# Patient Record
Sex: Male | Born: 1970 | Race: Black or African American | Hispanic: No | Marital: Married | Smoking: Never smoker
Health system: Southern US, Community
[De-identification: ages and names within clinical notes are randomized; demographics above are authoritative.]

---

## 2016-06-12 ENCOUNTER — Ambulatory Visit (HOSPITAL_COMMUNITY)
Admission: EM | Admit: 2016-06-12 | Discharge: 2016-06-12 | Disposition: A | Discharge status: Home / Self Care | Payer: Medicaid Other | Attending: Family Medicine | Admitting: Family Medicine

## 2016-06-12 ENCOUNTER — Encounter (HOSPITAL_COMMUNITY): Payer: Self-pay | Admitting: Emergency Medicine

## 2016-06-12 DIAGNOSIS — R05 Cough: Secondary | ICD-10-CM

## 2016-06-12 DIAGNOSIS — J40 Bronchitis, not specified as acute or chronic: Secondary | ICD-10-CM

## 2016-06-12 DIAGNOSIS — R059 Cough, unspecified: Secondary | ICD-10-CM

## 2016-06-12 MED ORDER — BENZONATATE 100 MG PO CAPS: 200.0000 mg | ORAL_CAPSULE | Freq: Three times a day (TID) | ORAL | 0 refills | Status: DC | PRN

## 2016-06-12 MED ORDER — AZITHROMYCIN 250 MG PO TABS: 250.0000 mg | ORAL_TABLET | Freq: Every day | ORAL | 0 refills | Status: DC

## 2016-06-12 NOTE — ED Provider Notes (Signed)
CSN: [id number]     Arrival date & time 06/12/16  1351 History   None    Chief Complaint  Patient presents with  . URI   (Consider location/radiation/quality/duration/timing/severity/associated sxs/prior Treatment) Patient c/o cough and congestion and uri sx's for over a week.  He has been having cough which is productive and worse at night.   The history is provided by the patient.  URI  Presenting symptoms: congestion, fatigue and rhinorrhea   Severity:  Moderate Duration:  1 week Timing:  Constant Progression:  Worsening Chronicity:  New Relieved by:  Nothing Worsened by:  Nothing   History reviewed. No pertinent past medical history. History reviewed. No pertinent surgical history. No family history on file. Social History  Substance Use Topics  . Smoking status: Never Smoker  . Smokeless tobacco: Not on file  . Alcohol use Not on file    Review of Systems  Constitutional: Positive for fatigue.  HENT: Positive for congestion and rhinorrhea.   Eyes: Negative.   Respiratory: Negative.   Cardiovascular: Negative.   Gastrointestinal: Negative.   Endocrine: Negative.   Genitourinary: Negative.   Musculoskeletal: Negative.   Skin: Negative.   Allergic/Immunologic: Negative.   Neurological: Negative.   Hematological: Negative.   Psychiatric/Behavioral: Negative.     Allergies  Patient has no known allergies.  Home Medications   Prior to Admission medications   Medication Sig Start Date End Date Taking? Authorizing Provider  azithromycin (ZITHROMAX) 250 MG tablet Take 1 tablet (250 mg total) by mouth daily. Take first 2 tablets together, then 1 every day until finished. 06/12/16   Deatra CanterWilliam J Choua Ikner, FNP  benzonatate (TESSALON) 100 MG capsule Take 2 capsules (200 mg total) by mouth 3 (three) times daily as needed for cough. 06/12/16   Deatra CanterWilliam J Jesalyn Finazzo, FNP   Meds Ordered and Administered this Visit  Medications - No data to display  BP 107/77 (BP Location:  Left Arm)   Pulse 75   Temp 97.6 F (36.4 C) (Oral)   Resp 18   SpO2 99%  No data found.   Physical Exam  Constitutional: He appears well-developed and well-nourished.  HENT:  Head: Normocephalic and atraumatic.  Right Ear: External ear normal.  Left Ear: External ear normal.  Mouth/Throat: Oropharynx is clear and moist.  Eyes: Conjunctivae and EOM are normal. Pupils are equal, round, and reactive to light.  Neck: Normal range of motion. Neck supple.  Cardiovascular: Normal rate, regular rhythm and normal heart sounds.   Pulmonary/Chest: Effort normal and breath sounds normal.  Abdominal: Soft. Bowel sounds are normal.  Nursing note and vitals reviewed.   Urgent Care Course   Clinical Course     Procedures (including critical care time)  Labs Review Labs Reviewed - No data to display  Imaging Review No results found.   Visual Acuity Review  Right Eye Distance:   Left Eye Distance:   Bilateral Distance:    Right Eye Near:   Left Eye Near:    Bilateral Near:         MDM   1. Bronchitis   2. Cough    zpak Tessalon  Push po fluids, rest, tylenol and motrin otc prn as directed for fever, arthralgias, and myalgias.  Follow up prn if sx's continue or persist.    Deatra CanterWilliam J Mercades Bajaj, FNP 06/12/16 [phone]

## 2016-06-12 NOTE — ED Notes (Signed)
[place] been in [place]

## 2016-06-12 NOTE — ED Triage Notes (Signed)
Cough and cold symptoms and feels feverish particularly at night

## 2016-07-05 ENCOUNTER — Ambulatory Visit (INDEPENDENT_AMBULATORY_CARE_PROVIDER_SITE_OTHER): Payer: Medicaid Other | Admitting: Internal Medicine

## 2016-07-05 ENCOUNTER — Other Ambulatory Visit (HOSPITAL_COMMUNITY)
Admission: RE | Admit: 2016-07-05 | Discharge: 2016-07-05 | Disposition: A | Discharge status: Home / Self Care | Payer: Medicaid Other | Source: Ambulatory Visit | Attending: Family Medicine | Admitting: Family Medicine

## 2016-07-05 VITALS — BP 102/76 | HR 78 | Temp 98.4°F | Ht 69.0 in | Wt 183.4 lb

## 2016-07-05 DIAGNOSIS — Z789 Other specified health status: Secondary | ICD-10-CM

## 2016-07-05 DIAGNOSIS — Z0289 Encounter for other administrative examinations: Secondary | ICD-10-CM

## 2016-07-05 DIAGNOSIS — Z113 Encounter for screening for infections with a predominantly sexual mode of transmission: Secondary | ICD-10-CM | POA: Diagnosis not present

## 2016-07-05 LAB — POCT URINALYSIS DIPSTICK
BILIRUBIN UA: NEGATIVE
Blood, UA: NEGATIVE
Glucose, UA: NEGATIVE
KETONES UA: NEGATIVE
Leukocytes, UA: NEGATIVE
Nitrite, UA: NEGATIVE
PH UA: 7.5
PROTEIN UA: NEGATIVE
Spec Grav, UA: 1.015
Urobilinogen, UA: 0.2

## 2016-07-05 LAB — CBC WITH DIFFERENTIAL/PLATELET
Basophils Absolute: 0 cells/uL (ref 0–200)
Basophils Relative: 0 %
EOS PCT: 9 %
Eosinophils Absolute: 414 cells/uL (ref 15–500)
HCT: 48.1 % (ref 38.5–50.0)
HEMOGLOBIN: 16.3 g/dL (ref 13.2–17.1)
LYMPHS ABS: 2300 {cells}/uL (ref 850–3900)
Lymphocytes Relative: 50 %
MCH: 31.6 pg (ref 27.0–33.0)
MCHC: 33.9 g/dL (ref 32.0–36.0)
MCV: 93.2 fL (ref 80.0–100.0)
MONOS PCT: 13 %
MPV: 11.3 fL (ref 7.5–12.5)
Monocytes Absolute: 598 cells/uL (ref 200–950)
NEUTROS ABS: 1288 {cells}/uL — AB (ref 1500–7800)
NEUTROS PCT: 28 %
PLATELETS: 129 10*3/uL — AB (ref 140–400)
RBC: 5.16 MIL/uL (ref 4.20–5.80)
RDW: 13.2 % (ref 11.0–15.0)
WBC: 4.6 10*3/uL (ref 3.8–10.8)

## 2016-07-05 LAB — COMPREHENSIVE METABOLIC PANEL
ALBUMIN: 4.2 g/dL (ref 3.6–5.1)
ALT: 18 U/L (ref 9–46)
AST: 19 U/L (ref 10–40)
Alkaline Phosphatase: 49 U/L (ref 40–115)
BILIRUBIN TOTAL: 0.9 mg/dL (ref 0.2–1.2)
BUN: 11 mg/dL (ref 7–25)
CHLORIDE: 101 mmol/L (ref 98–110)
CO2: 29 mmol/L (ref 20–31)
CREATININE: 0.8 mg/dL (ref 0.60–1.35)
Calcium: 9.1 mg/dL (ref 8.6–10.3)
Glucose, Bld: 67 mg/dL (ref 65–99)
Potassium: 4.1 mmol/L (ref 3.5–5.3)
SODIUM: 137 mmol/L (ref 135–146)
Total Protein: 7.5 g/dL (ref 6.1–8.1)

## 2016-07-05 LAB — LIPID PANEL
Cholesterol: 172 mg/dL (ref <200)
HDL: 45 mg/dL (ref >40)
LDL CALC: 82 mg/dL (ref <100)
TRIGLYCERIDES: 227 mg/dL — AB (ref <150)
Total CHOL/HDL Ratio: 3.8 Ratio (ref <5.0)
VLDL: 45 mg/dL — AB (ref <30)

## 2016-07-05 LAB — TSH: TSH: 1.44 m[IU]/L (ref 0.40–4.50)

## 2016-07-05 NOTE — Patient Instructions (Signed)
It was nice meeting you today Mr. Louis Morse!  Today we have drawn you blood and collected a urine sample. We will discuss the results of these tests with you when you return for your next visit in 3-4 weeks. After that, we will only need to see you once a year unless you get sick or have concerns you would like to discuss.   If you have any questions or concerns, please feel free to call the clinic.   Be well,  Dr. Natale MilchLancaster

## 2016-07-05 NOTE — Assessment & Plan Note (Signed)
Not yet seen at HD.  F/u on 08/06/16.  

## 2016-07-05 NOTE — Assessment & Plan Note (Signed)
Needs interpreter. Primary Language Kinyarwanda.

## 2016-07-05 NOTE — Progress Notes (Signed)
Louis Morse utilized during today's visit.  [facility] New Patient Visit  HPI:  [facility] for a new patient appointment to establish general primary care.  ROS:  Review of Systems  Gastrointestinal: Negative for abdominal pain.  Genitourinary: Positive for flank pain.  Musculoskeletal: Negative for back pain.    Past Medical Hx:  -Kidney problem a long time ago, used to take medicine. Medication was too expensive so he could not afford to refill it after the first month. Still has same pain when standing for a long time but on sides. Same intensity on both sides. Does not currently have pain.   Past Surgical Hx:  -None  Family Hx: updated in Epic - Number of family members:  Has 10 children and is married - Number of family members in [place]:  All 10 children and wife Lives with wife and 7 youngest children. Older two are married and one is at boarding school. They have been here for a year already.   Immigrant Social History: - Name spelling correct?: NO. Louis Morse.  - Date arrived in [place]: 05/28/2016 - Country of origin: [place] - Location of refugee camp (if applicable), how long there, and what caused patient to leave home country?: Yes, [place] years in [place] war - Primary language: Kinyarwanda  -Requires intepreter (essentially speaks no [place]) - Education: Highest level of education: 2 years - Prior work: [employer] - Best family contact/phone number [phone] - Tobacco/alcohol/drug use: None; yes EtOH, one or two glasses after dinner of beer; none - Marriage Status: Married - Sexual activity: Yes  - Class B conditions: None - Were you beaten or tortured in your country or refugee camp?  No   Preventative Care History: -Seen at health department?: No  PHYSICAL EXAM: BP 102/76 (BP Location: Right Arm, Patient Position: Sitting, Cuff Size: Normal)   Pulse 78   Temp 98.4 F (36.9 C) (Oral)   Ht 5\' 9"  (1.753 m)   Wt 183 lb 6.4 oz  (83.2 kg)   SpO2 97%   BMI 27.08 kg/m  Gen: well-appearing very pleasant male in NAD HEENT: [place] AT, PERRLA, muddy sclera, no pallor, MMM, no oropharyngeal erythema or exudates, TM pearly, non-bulging bilaterally  Neck:  Supple, no lymphadenopathy Heart: RRR, no murmurs appreciated Lungs: CTAB, no wheezes, rales, rhonchi, normal WOB on RA Abdomen: soft, non-tender, non-distended, +BS, no flank pain Skin:  Hypertrophic scar noted on R temple MSK: 5/5 strength upper and lower extremities bilaterally, no gait abnormalities Neuro: A&Ox3, no focal deficits Psych: Appropriate mood and affect  Examined and interviewed with Dr. Rejeana BrockWalden  Veta Dambrosia J Karlyn Glasco, MD, MPH PGY-2 [facility] Pager [phone]

## 2016-07-06 LAB — HIV ANTIBODY (ROUTINE TESTING W REFLEX): HIV: NONREACTIVE

## 2016-07-06 LAB — ACUTE HEP PANEL AND HEP B SURFACE AB
HCV Ab: NEGATIVE
HEP B S AB: POSITIVE — AB
Hep A IgM: NONREACTIVE
Hep B C IgM: NONREACTIVE
Hepatitis B Surface Ag: NEGATIVE

## 2016-07-06 LAB — QUANTIFERON TB GOLD ASSAY (BLOOD)
INTERFERON GAMMA RELEASE ASSAY: POSITIVE — AB
MITOGEN-NIL SO: 9.43 [IU]/mL
QUANTIFERON TB AG MINUS NIL: 6.14 [IU]/mL
Quantiferon Nil Value: 0.18 IU/mL

## 2016-07-06 LAB — SICKLE CELL SCREEN: SICKLE CELL SCREEN: NEGATIVE

## 2016-07-06 LAB — RPR

## 2016-07-07 LAB — URINE CULTURE: ORGANISM ID, BACTERIA: NO GROWTH

## 2016-07-08 LAB — URINE CYTOLOGY ANCILLARY ONLY
Chlamydia: NEGATIVE
Neisseria Gonorrhea: NEGATIVE

## 2016-07-08 LAB — VARICELLA ZOSTER ANTIBODY, IGG: VARICELLA IGG: 723.5 {index} — AB (ref <135.00)

## 2016-08-05 ENCOUNTER — Ambulatory Visit (HOSPITAL_COMMUNITY)
Admission: EM | Admit: 2016-08-05 | Discharge: 2016-08-05 | Disposition: A | Discharge status: Home / Self Care | Payer: Medicaid Other | Attending: Internal Medicine | Admitting: Internal Medicine

## 2016-08-05 DIAGNOSIS — J069 Acute upper respiratory infection, unspecified: Secondary | ICD-10-CM

## 2016-08-05 NOTE — ED Provider Notes (Signed)
CSN: [id number]     Arrival date & time 08/05/16  1031 History   First MD Initiated Contact with Patient 08/05/16 1248     Chief Complaint  Patient presents with  . URI   (Consider location/radiation/quality/duration/timing/severity/associated sxs/prior Treatment) 46 year old male African immigrant, does not speak English, video interpreter used, complaining of sore throat, cough, throat pain with swallowing, runny nose, headache and occasional shortness of breath due to nasal stuffiness. Uncertain as to whether he may of had a fever. None documented.      No past medical history on file. No past surgical history on file. No family history on file. Social History  Substance Use Topics  . Smoking status: Never Smoker  . Smokeless tobacco: Not on file  . Alcohol use Not on file    Review of Systems  Constitutional: Positive for activity change. Negative for diaphoresis, fatigue and fever.  HENT: Positive for congestion, postnasal drip, rhinorrhea and sore throat. Negative for ear pain, facial swelling and trouble swallowing.   Eyes: Negative for pain, discharge and redness.  Respiratory: Positive for cough. Negative for chest tightness and shortness of breath.   Cardiovascular: Negative.   Gastrointestinal: Negative.   Musculoskeletal: Negative.  Negative for neck pain and neck stiffness.  Neurological: Negative.   All other systems reviewed and are negative.   Allergies  Patient has no known allergies.  Home Medications   Prior to Admission medications   Not on File   Meds Ordered and Administered this Visit  Medications - No data to display  There were no vitals taken for this visit. No data found.   Physical Exam  Constitutional: He is oriented to person, place, and time. He appears well-developed and well-nourished. No distress.  HENT:  Right Ear: External ear normal.  Left Ear: External ear normal.  Mouth/Throat: No oropharyngeal exudate.  Oropharynx with  minor streaky erythema, clear PND. No swelling or exudate. Airway widely patent.  Eyes: EOM are normal. Pupils are equal, round, and reactive to light.  Neck: Normal range of motion. Neck supple.  Cardiovascular: Normal rate, regular rhythm, normal heart sounds and intact distal pulses.   Pulmonary/Chest: Effort normal and breath sounds normal. No respiratory distress. He has no wheezes. He has no rales.  Musculoskeletal: Normal range of motion. He exhibits no edema.  Lymphadenopathy:    He has no cervical adenopathy.  Neurological: He is alert and oriented to person, place, and time.  Skin: Skin is warm and dry.  Nursing note and vitals reviewed.   Urgent Care Course     Procedures (including critical care time)  Labs Review Labs Reviewed - No data to display  Imaging Review No results found.   Visual Acuity Review  Right Eye Distance:   Left Eye Distance:   Bilateral Distance:    Right Eye Near:   Left Eye Near:    Bilateral Near:         MDM   1. Acute upper respiratory infection    Sudafed PE 10 mg every 4 to 6 hours as needed for congestion Allegra or Zyrtec daily as needed for drainage and runny nose. For stronger antihistamine may take Chlor-Trimeton 2 to 4 mg every 4 to 6 hours, may cause drowsiness. Saline nasal spray used frequently. Ibuprofen 600 mg every 6 hours as needed for pain, discomfort or fever. Drink plenty of fluids and stay well-hydrated.     Hayden Rasmussenavid Chamara Dyck, NP 08/05/16 1332

## 2016-08-05 NOTE — Discharge Instructions (Signed)
Sudafed PE 10 mg every 4 to 6 hours as needed for congestion °Allegra or Zyrtec daily as needed for drainage and runny nose. °For stronger antihistamine may take Chlor-Trimeton 2 to 4 mg every 4 to 6 hours, may cause drowsiness. °Saline nasal spray used frequently. °Ibuprofen 600 mg every 6 hours as needed for pain, discomfort or fever. °Drink plenty of fluids and stay well-hydrated. °

## 2016-08-05 NOTE — ED Triage Notes (Signed)
C/o cold sx States unable to close mouth while breathing  States he does have a cough and sob No meds taking

## 2016-08-06 ENCOUNTER — Encounter: Payer: Self-pay | Admitting: Internal Medicine

## 2016-08-06 ENCOUNTER — Ambulatory Visit (INDEPENDENT_AMBULATORY_CARE_PROVIDER_SITE_OTHER): Payer: Medicaid Other | Admitting: Internal Medicine

## 2016-08-06 DIAGNOSIS — B9789 Other viral agents as the cause of diseases classified elsewhere: Secondary | ICD-10-CM | POA: Diagnosis not present

## 2016-08-06 DIAGNOSIS — E781 Pure hyperglyceridemia: Secondary | ICD-10-CM

## 2016-08-06 DIAGNOSIS — E785 Hyperlipidemia, unspecified: Secondary | ICD-10-CM | POA: Insufficient documentation

## 2016-08-06 DIAGNOSIS — J069 Acute upper respiratory infection, unspecified: Secondary | ICD-10-CM | POA: Diagnosis present

## 2016-08-06 DIAGNOSIS — R7612 Nonspecific reaction to cell mediated immunity measurement of gamma interferon antigen response without active tuberculosis: Secondary | ICD-10-CM | POA: Diagnosis not present

## 2016-08-06 MED ORDER — ATORVASTATIN CALCIUM 20 MG PO TABS: 20.0000 mg | ORAL_TABLET | Freq: Every day | ORAL | 3 refills | Status: DC

## 2016-08-06 NOTE — Assessment & Plan Note (Signed)
Generally well-appearing though with occasional coughing and rhinorrhea throughout encounter. Afebrile and normal WOB. Attempted to explain OTC medications to patient, however he had difficulty grasping this concept. As such, prescribed Sudafed, ibuprofen, and Zyrtec. Interpreter wrote medication directions in [place] for patient and his wife. Patient voiced understanding of how to take medications and where to pick them up.

## 2016-08-06 NOTE — Progress Notes (Signed)
   Subjective:   Patient: Louis Morse       Birthdate: 04/29/1971       MRN: [id number]      HPI  Louis Morse is a 46 y.o. male presenting for f/u.  [employer] Interpreter present for encounter.   HLD Patient with elevated triglycerides on cholesterol panel obtained at refugee clinic appointment. This is a new diagnosis. He has never taken medication for this but is agreeable to beginning a statin.   Positive quant gold Patient with positive quant gold on routine testing at refugee clinic appointment. [facility], however patient says he has not been contacted. Patient denies history of Tb or known exposure to Tb. He developed a cough accompanied by other URI symptoms a couple days ago, but denies chronic coughs. No fevers, night sweats, weight loss.   URI [facility]. He was sent home with instructions to pick up OTC medications (Sudafed, ibuprofen, Zyrtec), however he did not understand the instructions and instead tried to pick up a medication from the pharmacy. When there was no prescription for him, he did not know what to do, and thus has not taken any medication. As such, patient's symptoms have not improved since he was evaluated yesterday. He reports cough, nasal congestion, sore throat, and HA.   Smoking status reviewed. Patient is never smoker.   Review of Systems See HPI.     Objective:  Physical Exam  Constitutional: He is oriented to person, place, and time and well-developed, well-nourished, and in no distress.  HENT:  Head: Normocephalic and atraumatic.  Mouth/Throat: Oropharynx is clear and moist.  Eyes: Conjunctivae and EOM are normal. Right eye exhibits no discharge. Left eye exhibits no discharge.  Pulmonary/Chest:  Occasional coughing throughout encounter. Normal WOB on RA. Able to speak in full sentences. No wheezing or crackles.   Neurological: He is alert and  oriented to person, place, and time.  Skin: Skin is warm and dry.  Psychiatric: Affect and judgment normal.      Assessment & Plan:  URI (upper respiratory infection) Generally well-appearing though with occasional coughing and rhinorrhea throughout encounter. Afebrile and normal WOB. Attempted to explain OTC medications to patient, however he had difficulty grasping this concept. As such, prescribed Sudafed, ibuprofen, and Zyrtec. Interpreter wrote medication directions in [place] for patient and his wife. Patient voiced understanding of how to take medications and where to pick them up.    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 Health Department has been contacted. 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 Patient's questions answered and he voiced understanding. Explained to patient that he will be contacted with the date and time of an appointment by the Health Dept, and that it is important to keep this appointment. Discussed with Dr. Gwendolyn GrantWalden.   HLD (hyperlipidemia) Elevated triglycerides on routine initial lipid panel. Patient agreeable to beginning statin.  - Begin atorvastatin 20mg  qd   Tarri AbernethyAbigail J Aneta Hendershott, MD, MPH PGY-2 [facility] Pager [phone]

## 2016-08-06 NOTE — Assessment & Plan Note (Addendum)
No sx suspicious for active Tb, including no chronic cough (currently has mild cough more consistent with URI), weight loss, night sweats, fevers. Health Department has been contacted. 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 Patient's questions answered and he voiced understanding. Explained to patient that he will be contacted with the date and time of an appointment by the Health Dept, and that it is important to keep this appointment. Discussed with Dr. Gwendolyn GrantWalden.

## 2016-08-06 NOTE — Assessment & Plan Note (Signed)
Elevated triglycerides on routine initial lipid panel. Patient agreeable to beginning statin.  - Begin atorvastatin 20mg  qd

## 2016-08-06 NOTE — Patient Instructions (Signed)
It was nice seeing you again today Louis Morse!  Please begin taking the cholesterol medication (atorvastatin) once a day every day.   I will see you back in one year for your annual physical, or sooner if you are not feeling well.   If you have any questions or concerns, please feel free to call the clinic.   Be well,  Dr. Natale MilchLancaster

## 2016-09-12 ENCOUNTER — Encounter: Payer: Self-pay | Admitting: Internal Medicine

## 2016-09-12 NOTE — Progress Notes (Unsigned)
[employer] interpreter used:  Louis Morse Patient came into clinic today as a "walk-in".  He was confused about an appointment he thought he had here today at 10am.  Patient was supposed to be contacted by the health department per Dr. Tyson AliasWalden's last note for an appointment to follow up on his most recent quantiferon gold test.  I called tammy faucett with [facility] and she was able to fit patient in at 11:30am.  Patient unfortunately rode the bus and couldn't guarantee to make it on time for this.  We were able to send him by [employer] through a voucher with social work.  Will forward to integrated care to document on this also.  Shayonna Ocampo,CMA

## 2016-10-15 ENCOUNTER — Other Ambulatory Visit: Payer: Self-pay | Admitting: Infectious Disease

## 2016-10-15 ENCOUNTER — Ambulatory Visit
Admission: RE | Admit: 2016-10-15 | Discharge: 2016-10-15 | Disposition: A | Discharge status: Home / Self Care | Payer: Medicaid Other | Source: Ambulatory Visit | Attending: Infectious Disease | Admitting: Infectious Disease

## 2016-10-15 DIAGNOSIS — Z9289 Personal history of other medical treatment: Secondary | ICD-10-CM

## 2017-02-03 ENCOUNTER — Ambulatory Visit (INDEPENDENT_AMBULATORY_CARE_PROVIDER_SITE_OTHER): Payer: Medicaid Other | Admitting: Internal Medicine

## 2017-02-03 DIAGNOSIS — J069 Acute upper respiratory infection, unspecified: Secondary | ICD-10-CM

## 2017-02-03 MED ORDER — DM-GUAIFENESIN ER 30-600 MG PO TB12: 1.0000 | ORAL_TABLET | Freq: Two times a day (BID) | ORAL | 0 refills | Status: DC

## 2017-02-03 MED ORDER — CETIRIZINE HCL 10 MG PO TABS: 10.0000 mg | ORAL_TABLET | Freq: Every day | ORAL | 0 refills | Status: DC

## 2017-02-03 MED ORDER — IBUPROFEN 400 MG PO TABS: 400.0000 mg | ORAL_TABLET | Freq: Four times a day (QID) | ORAL | 0 refills | Status: DC | PRN

## 2017-02-03 NOTE — Progress Notes (Signed)
   [facility] Phone: [phone]  Subjective:  Louis BrunnerDamacene is a 46 year old male presenting to clinic for cough, congestion, and sore throat for the last 4 days. He thinks he has also had a fever, but he did not measure his temperature. He has coughed so much that it has caused some rib pain on both sides. He has not tried taking any medications for this. His cough is worse at night. He is coughing up clear mucus. He states the congestion makes it hard to breathe. No sick contacts. No chills. No nausea. No vomiting. No shortness of breath. No chest pain. No lower extremity edema.  ROS: See HPI for pertinent positives and negatives  Past Medical History- HLD  Family history reviewed for today's visit. No changes.  Social history- patient is a never smoker. He is a refugee.  Objective: BP 110/62 (BP Location: Left Arm, Patient Position: Sitting, Cuff Size: Normal)   Pulse 76   Temp 98.1 F (36.7 C) (Oral)   Ht 5\' 9"  (1.753 m)   Wt 197 lb 3.2 oz (89.4 kg)   SpO2 98%   BMI 29.12 kg/m  Gen: NAD, alert, cooperative with exam HEENT: NCAT, EOMI, MMM, TMs normal, oropharynx mildly erythematous, nasal turbinates erythematous, clear nasal discharge present Neck: FROM, supple, no cervical lymphadenopathy CV: RRR, no murmur Resp: CTABL, no wheezes, normal work of breathing GI: SNTND, BS present, no guarding or organomegaly Msk: No edema, warm, normal tone, moves UE/LE spontaneously  Assessment/Plan: Viral URI: History and exam consistent with URI. No signs of bacterial infection to suggest ear infection, pneumonia, etc. - Zyrtec for congestion - Mucinex DM for congestion and cough - Ibuprofen for rib pain, fevers, discomfort - Return precautions discussed - Follow-up if no improvement in 1-2 weeks   Willadean CarolKaty Mayo, MD PGY-3

## 2017-02-03 NOTE — Patient Instructions (Addendum)
For your cough- use Mucinex 1 tablet twice a day.  For your congestion- use Zyrtec 10mg  once a day  For your rib pain- use Ibuprofen 400mg  every 6 hours as needed.  -Dr. Nancy MarusMayo

## 2017-02-05 NOTE — Assessment & Plan Note (Signed)
History and exam consistent with URI. No signs of bacterial infection to suggest ear infection, pneumonia, etc. - Zyrtec for congestion - Mucinex DM for congestion and cough - Ibuprofen for rib pain, fevers, discomfort - Return precautions discussed - Follow-up if no improvement in 1-2 weeks

## 2017-12-25 ENCOUNTER — Emergency Department (HOSPITAL_COMMUNITY): Payer: Medicaid Other

## 2017-12-25 ENCOUNTER — Encounter (HOSPITAL_COMMUNITY): Payer: Self-pay | Admitting: Emergency Medicine

## 2017-12-25 ENCOUNTER — Emergency Department (HOSPITAL_COMMUNITY)
Admission: EM | Admit: 2017-12-25 | Discharge: 2017-12-25 | Disposition: A | Discharge status: Home / Self Care | Payer: Medicaid Other | Attending: Emergency Medicine | Admitting: Emergency Medicine

## 2017-12-25 DIAGNOSIS — Y998 Other external cause status: Secondary | ICD-10-CM | POA: Diagnosis not present

## 2017-12-25 DIAGNOSIS — S6010XA Contusion of unspecified finger with damage to nail, initial encounter: Secondary | ICD-10-CM

## 2017-12-25 DIAGNOSIS — Y92810 Car as the place of occurrence of the external cause: Secondary | ICD-10-CM | POA: Diagnosis not present

## 2017-12-25 DIAGNOSIS — Y9389 Activity, other specified: Secondary | ICD-10-CM | POA: Diagnosis not present

## 2017-12-25 DIAGNOSIS — S6991XA Unspecified injury of right wrist, hand and finger(s), initial encounter: Secondary | ICD-10-CM | POA: Diagnosis present

## 2017-12-25 DIAGNOSIS — S60111A Contusion of right thumb with damage to nail, initial encounter: Secondary | ICD-10-CM | POA: Diagnosis not present

## 2017-12-25 DIAGNOSIS — W230XXA Caught, crushed, jammed, or pinched between moving objects, initial encounter: Secondary | ICD-10-CM | POA: Insufficient documentation

## 2017-12-25 MED ORDER — LIDOCAINE HCL 2 % IJ SOLN
10.0000 mL | Freq: Once | INTRAMUSCULAR | Status: AC
  Administered 2017-12-25: 10 mg
  Filled 2017-12-25: qty 20

## 2017-12-25 NOTE — ED Provider Notes (Signed)
[facility]-EMERGENCY DEPT Provider Note   CSN: [id number] Arrival date & time: 12/25/17  0913     History   Chief Complaint Chief Complaint  Patient presents with  . Finger Injury    HPI Louis Morse is a 47 y.o. male otherwise healthy here presenting with right thumb pain.  She states about 2 weeks ago he was getting out of the car and excellently slammed the car door onto his right thumb.  He has progressive swelling and throbbing pain afterwards.  Patient saw a nurse and was prescribed topical cream which initially helped but over the last 2 to 3 days, patient has been having worsening right thumb pain and swelling.  Denies any purulent discharge or fevers.  She states that he is otherwise healthy and is not taking any meds currently and has no allergies.   The history is provided by the patient. A language interpreter was used.  [employer] interpreter used   History reviewed. No pertinent past medical history.  Patient Active Problem List   Diagnosis Date Noted  . URI (upper respiratory infection) 08/06/2016  . 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 HLD (hyperlipidemia) 08/06/2016  . Refugee health examination 07/05/2016  . Language barrier 07/05/2016    History reviewed. No pertinent surgical history.      Home Medications    Prior to Admission medications   Medication Sig Start Date End Date Taking? Authorizing Provider  atorvastatin (LIPITOR) 20 MG tablet Take 1 tablet (20 mg total) by mouth daily. Patient not taking: Reported on 12/25/2017 08/06/16   Marquette SaaLancaster, Abigail Joseph, MD  cetirizine (ZYRTEC) 10 MG tablet Take 1 tablet (10 mg total) by mouth daily. Patient not taking: Reported on 12/25/2017 02/03/17   MayoAllyn Kenner, Katy Dodd, MD  dextromethorphan-guaiFENesin [facility](MUCINEX DM) 30-600 MG 12hr tablet Take 1 tablet by mouth 2 (two) times daily. Patient not taking: Reported on 12/25/2017 02/03/17   MayoAllyn Kenner, Katy Dodd, MD  ibuprofen (ADVIL,MOTRIN) 400  MG tablet Take 1 tablet (400 mg total) by mouth every 6 (six) hours as needed. Patient not taking: Reported on 12/25/2017 02/03/17   Mayo, Allyn KennerKaty Dodd, MD    Family History No family history on file.  Social History Social History   Tobacco Use  . Smoking status: Never Smoker  . Smokeless tobacco: Never Used  Substance Use Topics  . Alcohol use: Yes  . Drug use: Not on file     Allergies   Patient has no known allergies.   Review of Systems Review of Systems  Musculoskeletal:       R thumb pain   All other systems reviewed and are negative.    Physical Exam Updated Vital Signs BP 119/87 (BP Location: Left Arm)   Pulse 87   Temp 97.9 F (36.6 C) (Oral)   Resp 16   SpO2 97%   Physical Exam  Constitutional: He appears well-developed.  HENT:  Head: Normocephalic.  Eyes: Pupils are equal, round, and reactive to light.  Neck: Normal range of motion.  Cardiovascular: Normal rate.  Pulmonary/Chest: Effort normal.  Abdominal: Soft.  Musculoskeletal:  R thumb with subungual hematoma, ? Paronychia vs hematoma as well. No evidence of felon. Diminished capillary refill likely from the surrounding swelling   Neurological: He is alert.  Skin: Skin is warm.  Psychiatric: He has a normal mood and affect.  Nursing note and vitals reviewed.    ED Treatments / Results  Labs (all labs ordered are listed, but only abnormal results are  displayed) Labs Reviewed - No data to display  EKG None  Radiology Dg Finger Thumb Right  Result Date: 12/25/2017 CLINICAL DATA:  Smashed thumb in door 2 weeks ago, persistent pain with subungual hematoma EXAM: RIGHT THUMB 2+V COMPARISON:  None. FINDINGS: No definite fracture or dislocation is seen. The tuft of the distal phalanx appears intact. The joint spaces appear normal. IMPRESSION: No acute fracture. Electronically Signed   By: Dwyane Dee M.D.   On: 12/25/2017 10:32    Procedures Procedures (including critical care  time)  Trephination  I performed digital block with 2 % lidocaine, about 5 cc in total. I was able to perform trephination of the nail and some blood came out. I was able to cut with scapel and drain the subungual hematoma.    Medications Ordered in ED Medications  lidocaine (XYLOCAINE) 2 % (with pres) injection 200 mg (10 mg Infiltration Given by Other 12/25/17 1106)     Initial Impression / Assessment and Plan / ED Course  I have reviewed the triage vital signs and the nursing notes.  Pertinent labs & imaging results that were available during my care of the patient were reviewed by me and considered in my medical decision making (see chart for details).     Louis Morse is a 47 y.o. male here with R thumb swelling after injury 2 weeks ago. Consider subungual hematoma vs paronychia. Will get xray to r/o fracture and will do digital block and likely need trephination. If trephination didn't help with the swelling, may need to drain the fluid collection.   11:37 AM Xray showed no fractures. I tried trephination but only minimal blood came out. Then used a scapel and open up the cuticle and drained the subungual hematoma, no obvious purulent drainaged to suggest paronychia. Will dc home.    Final Clinical Impressions(s) / ED Diagnoses   Final diagnoses:  None    ED Discharge Orders    None       Charlynne Pander, MD 12/25/17 1142

## 2017-12-25 NOTE — ED Triage Notes (Signed)
Using [employer] Swahili, pt got his right thumb shut in door 2 weeks ago. Pt saw nurse at work who gave him medicine but still has severe pain causing him no to be able to sleep.

## 2017-12-25 NOTE — Discharge Instructions (Signed)
Keep dressing in place for 24 hrs. If it gets soaked, you may change it.   The blood may drain out for the next 2 days.   See primary care doctor.   Return to ER if you have worse thumb pain, purulent discharge, fever.

## 2018-05-19 ENCOUNTER — Ambulatory Visit (HOSPITAL_COMMUNITY)
Admission: EM | Admit: 2018-05-19 | Discharge: 2018-05-19 | Disposition: A | Discharge status: Home / Self Care | Payer: BLUE CROSS/BLUE SHIELD | Attending: Family Medicine | Admitting: Family Medicine

## 2018-05-19 ENCOUNTER — Encounter (HOSPITAL_COMMUNITY): Payer: Self-pay

## 2018-05-19 ENCOUNTER — Other Ambulatory Visit: Payer: Self-pay

## 2018-05-19 DIAGNOSIS — M545 Low back pain, unspecified: Secondary | ICD-10-CM

## 2018-05-19 MED ORDER — NAPROXEN 500 MG PO TABS: 500.0000 mg | ORAL_TABLET | Freq: Two times a day (BID) | ORAL | 0 refills | Status: DC

## 2018-05-19 NOTE — ED Triage Notes (Signed)
Pt cc back pain and cough x 2 days

## 2018-05-19 NOTE — Discharge Instructions (Addendum)

## 2018-05-19 NOTE — ED Provider Notes (Signed)
[facility]   [id number] 05/19/18 Arrival Time: 1604  ASSESSMENT & PLAN:  1. Acute bilateral low back pain without sciatica   Normal neurologic exam.  Meds ordered this encounter  Medications  . naproxen (NAPROSYN) 500 MG tablet    Sig: Take 1 tablet (500 mg total) by mouth 2 (two) times daily with a meal.    Dispense:  20 tablet    Refill:  0   Encouraged ROM as he tolerates. Work note for today and tomorrow given. Will return if not seeing improvement over the next few days to one week.  Reviewed expectations re: course of current medical issues. Questions answered. Outlined signs and symptoms indicating need for more acute intervention. Patient verbalized understanding. After Visit Summary given.   SUBJECTIVE: History from: patient. Kiswahili video interpreter used.  Louis Morse is a 47 y.o. male who presents with complaint of fairly persistent bilateral lower back discomfort. Onset gradual, over the past few weeks. Injury/trama: no, but works Engineer, manufacturing systemsdaily cutting chickens and questions relation. Stands all day in a cold environment. History of back problems: none reported. Discomfort described as soreness without radiation. Certain movements exacerbate the described discomfort. Better with rest. Extremity sensation changes or weakness: none. Ambulatory without difficulty. Normal bowel/bladder habits: yes. No associated abdominal pain/n/v. Self treatment: has tried nothing for pain relief.  Reports no fevers, IV drug use, or recent back surgeries or procedures.  ROS: As per HPI. All other systems negative.    OBJECTIVE:  Vitals:   05/19/18 1644 05/19/18 1645  BP: 122/86   Pulse: 82   Resp: 18   Temp: 98.1 F (36.7 C)   SpO2: 99%   Weight:  70 kg    General appearance: alert; no distress Neck: supple with FROM; without midline tenderness CV: RRR without murmer Lungs: unlabored respirations; symmetrical air entry Abdomen: soft, non-tender;  non-distended Back: moderate bilateral (R>L) tenderness of his lower paraspinal musculature; FROM at waist; bruising: none; without midline tenderness Extremities: no edema; symmetrical with no gross deformities; normal ROM of bilateral lower extremities Skin: warm and dry Neurologic: normal gait; normal reflexes of RLE and LLE; normal sensation of RLE and LLE; normal strength of RLE and LLE Psychological: alert and cooperative; normal mood and affect  No Known Allergies  PMH: Bronchitis.  Social History   Socioeconomic History  . Marital status: Married    Spouse name: Not on file  . Number of children: Not on file  . Years of education: Not on file  . Highest education level: Not on file  Occupational History  . Not on file  Social Needs  . Financial resource strain: Not on file  . Food insecurity:    Worry: Not on file    Inability: Not on file  . Transportation needs:    Medical: Not on file    Non-medical: Not on file  Tobacco Use  . Smoking status: Never Smoker  . Smokeless tobacco: Never Used  Substance and Sexual Activity  . Alcohol use: Yes  . Drug use: Not on file  . Sexual activity: Not on file  Lifestyle  . Physical activity:    Days per week: Not on file    Minutes per session: Not on file  . Stress: Not on file  Relationships  . Social connections:    Talks on phone: Not on file    Gets together: Not on file    Attends religious service: Not on file    Active member of club  or organization: Not on file    Attends meetings of clubs or organizations: Not on file    Relationship status: Not on file  . Intimate partner violence:    Fear of current or ex partner: Not on file    Emotionally abused: Not on file    Physically abused: Not on file    Forced sexual activity: Not on file  Other Topics Concern  . Not on file  Social History Narrative   From [place], moved to [place] in 05/28/16. Has 10 children and is married. All 10 children are in [place]. Lives with  wife and 7 youngest children. Older two are married and one is at boarding school. They have been here for a year already.    FH: Unknown.  History reviewed. No pertinent surgical history.   Mardella Layman, MD 05/20/18 [phone]

## 2018-10-21 ENCOUNTER — Other Ambulatory Visit: Payer: Self-pay

## 2018-10-21 ENCOUNTER — Emergency Department (HOSPITAL_COMMUNITY)
Admission: EM | Admit: 2018-10-21 | Discharge: 2018-10-21 | Disposition: A | Discharge status: Home / Self Care | Payer: BLUE CROSS/BLUE SHIELD | Attending: Emergency Medicine | Admitting: Emergency Medicine

## 2018-10-21 ENCOUNTER — Emergency Department (HOSPITAL_COMMUNITY): Payer: BLUE CROSS/BLUE SHIELD

## 2018-10-21 ENCOUNTER — Encounter (HOSPITAL_COMMUNITY): Payer: Self-pay | Admitting: Student

## 2018-10-21 DIAGNOSIS — M5441 Lumbago with sciatica, right side: Secondary | ICD-10-CM | POA: Insufficient documentation

## 2018-10-21 DIAGNOSIS — M79604 Pain in right leg: Secondary | ICD-10-CM | POA: Diagnosis present

## 2018-10-21 DIAGNOSIS — Z79899 Other long term (current) drug therapy: Secondary | ICD-10-CM | POA: Diagnosis not present

## 2018-10-21 MED ORDER — METHOCARBAMOL 500 MG PO TABS: 500.0000 mg | ORAL_TABLET | Freq: Three times a day (TID) | ORAL | 0 refills | Status: DC | PRN

## 2018-10-21 MED ORDER — NAPROXEN 500 MG PO TABS: 500.0000 mg | ORAL_TABLET | Freq: Two times a day (BID) | ORAL | 0 refills | Status: DC

## 2018-10-21 MED ORDER — NAPROXEN 250 MG PO TABS
500.0000 mg | ORAL_TABLET | Freq: Once | ORAL | Status: AC
Start: 2018-10-21 — End: 2018-10-21
  Administered 2018-10-21: 10:00:00 500 mg via ORAL
  Filled 2018-10-21: qty 2

## 2018-10-21 NOTE — ED Triage Notes (Signed)
Pt reports leg pain and fell in bathroom and now Leg and back hurt

## 2018-10-21 NOTE — Discharge Instructions (Addendum)
You were seen in the emergency department for back pain today.  At this time we suspect that your pain is related to a muscle strain/spasm.   I have prescribed you an anti-inflammatory medication and a muscle relaxer.  - Naproxen is a nonsteroidal anti-inflammatory medication that will help with pain and swelling. Be sure to take this medication as prescribed with food, 1 pill every 12 hours,  It should be taken with food, as it can cause stomach upset, and more seriously, stomach bleeding. Do not take other nonsteroidal anti-inflammatory medications with this such as Advil, Motrin, Aleve, Mobic, Goodie Powder, or Motrin.    - Robaxin is the muscle relaxer I have prescribed, this is meant to help with muscle tightness. Be aware that this medication may make you drowsy therefore the first time you take this it should be at a time you are in an environment where you can rest. Do not drive or operate heavy machinery when taking this medication. Do not drink alcohol or take other sedating medications with this medicine such as narcotics or benzodiazepines.   You make take Tylenol per over the counter dosing with these medications.   We have prescribed you new medication(s) today. Discuss the medications prescribed today with your pharmacist as they can have adverse effects and interactions with your other medicines including over the counter and prescribed medications. Seek medical evaluation if you start to experience new or abnormal symptoms after taking one of these medicines, seek care immediately if you start to experience difficulty breathing, feeling of your throat closing, facial swelling, or rash as these could be indications of a more serious allergic reaction  Your xrays did not show significant abnormalities. Your coccyx (tailbone) appears mildly injured but this is not where your pain is.   The application of heat can help soothe the pain.  Maintaining your daily activities, including walking,  is encourged, as it will help you get better faster than just staying in bed.  Your pain should get better over the next 2 weeks.  You will need to follow up with  Your primary healthcare provider in 1-2 weeks for reassessment, if you do not have a primary care provider one is provided in your discharge instructions- you may see the [facility] or call the provided phone number. However return to the ER should you develop ne or worsening symptoms or any other concerns including but not limited to severe or worsening pain, low back pain with fever, numbness, weakness, loss of bowel or bladder control, or inability to walk or urinate, you should return to the ER immediately.

## 2018-10-21 NOTE — ED Notes (Signed)
Patient verbalizes understanding of discharge instructions . Opportunity for questions and answers were provided . Armband removed by staff ,Pt discharged from ED. W/C  offered at D/C  and Declined W/C at D/C and was escorted to lobby by RN.  

## 2018-10-21 NOTE — ED Triage Notes (Signed)
PT ambulatory to room 6 with out assistanced

## 2018-10-21 NOTE — ED Provider Notes (Signed)
[facility]   CSN: [id number] Arrival date & time: 10/21/18  0901   History   Chief Complaint Chief Complaint  Patient presents with  . Leg Pain    HPI Louis Morse is a 48 y.o. male without significant past medical hx who presents to the ED with complaints of RLE pain x 2 weeks. Patient states pain starts in the right lower back and radiates down the RLE. Pain is intermittent, worse with moving & heavy lifting, sometimes alleviated by application of icy hot. States at times has some paresthesias to the RLE w/ the pain. Does a lot of heavy lifting at work. Did fall once when he missstepped, but this was after onset of pain. Current pain is an 8/10 in severity. Denies numbness, weakness, saddle anesthesia, incontinence to bowel/bladder, fever, chills, IV drug use, dysuria, or hx of cancer. Patient has not had prior back surgeries. Denies leg swelling, recent travel, or prior DVT.   Translator line utilized throughout Audiological scientistencounter.         HPI  History reviewed. No pertinent past medical history.  Patient Active Problem List   Diagnosis Date Noted  . URI (upper respiratory infection) 08/06/2016  . 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 HLD (hyperlipidemia) 08/06/2016  . Refugee health examination 07/05/2016  . Language barrier 07/05/2016    History reviewed. No pertinent surgical history.      Home Medications    Prior to Admission medications   Medication Sig Start Date End Date Taking? Authorizing Provider  atorvastatin (LIPITOR) 20 MG tablet Take 1 tablet (20 mg total) by mouth daily. Patient not taking: Reported on 12/25/2017 08/06/16   Marquette SaaLancaster, Abigail Joseph, MD  cetirizine (ZYRTEC) 10 MG tablet Take 1 tablet (10 mg total) by mouth daily. Patient not taking: Reported on 12/25/2017 02/03/17   MayoAllyn Kenner, Katy Dodd, MD  dextromethorphan-guaiFENesin [facility](MUCINEX DM) 30-600 MG 12hr tablet Take 1 tablet by mouth 2  (two) times daily. Patient not taking: Reported on 12/25/2017 02/03/17   MayoAllyn Kenner, Katy Dodd, MD  ibuprofen (ADVIL,MOTRIN) 400 MG tablet Take 1 tablet (400 mg total) by mouth every 6 (six) hours as needed. Patient not taking: Reported on 12/25/2017 02/03/17   MayoAllyn Kenner, Katy Dodd, MD  naproxen (NAPROSYN) 500 MG tablet Take 1 tablet (500 mg total) by mouth 2 (two) times daily with a meal. 05/19/18   Mardella LaymanHagler, Brian, MD    Family History History reviewed. No pertinent family history.  Social History Social History   Tobacco Use  . Smoking status: Never Smoker  . Smokeless tobacco: Never Used  Substance Use Topics  . Alcohol use: Yes  . Drug use: Not Currently     Allergies   Patient has no known allergies.   Review of Systems Review of Systems  Constitutional: Negative for chills, fever and unexpected weight change.  Respiratory: Negative for shortness of breath.   Cardiovascular: Negative for chest pain and leg swelling.  Gastrointestinal: Negative for abdominal pain, nausea and vomiting.  Genitourinary: Negative for dysuria.  Musculoskeletal: Positive for arthralgias, back pain and myalgias.  Neurological: Negative for weakness and numbness.       Negative for saddle anesthesia or bowel/bladder incontinence.      Physical Exam Updated Vital Signs BP (!) 124/100 (BP Location: Right Arm)   Pulse 66   Temp 98.2 F (36.8 C) (Oral)   Resp 16   SpO2 99%    Physical Exam Vitals signs and nursing note reviewed.  Constitutional:  General: He is not in acute distress.    Appearance: He is well-developed. He is not ill-appearing or toxic-appearing.  HENT:     Head: Normocephalic and atraumatic.  Neck:     Musculoskeletal: Normal range of motion and neck supple. No spinous process tenderness or muscular tenderness.  Cardiovascular:     Pulses:          Dorsalis pedis pulses are 2+ on the right side and 2+ on the left side.       Posterior tibial pulses are 2+ on the right side and  2+ on the left side.  Pulmonary:     Effort: Pulmonary effort is normal.  Musculoskeletal:     Comments: No obvious deformity, appreciable swelling, erythema, ecchymosis, significant open wounds, or increased warmth.  Lower extremities: Intact AROM throughout. Tender throughout posterior hip & greater trochanter of RLE. Otherwise nontender. Soft compartments.  Back: Tender to diffuse midline and R sided lumbar paraspinal muscles. No point/focal vertebral tenderness, no palpable step off or crepitus.   Skin:    General: Skin is warm and dry.     Capillary Refill: Capillary refill takes less than 2 seconds.     Findings: No rash.  Neurological:     Mental Status: He is alert.     Deep Tendon Reflexes:     Reflex Scores:      Patellar reflexes are 2+ on the right side and 2+ on the left side.    Comments: Sensation grossly intact to bilateral lower extremities. 5/5 symmetric strength with hip flexion, knee flexion/extension, and ankle plantar/dorsiflexion bilaterally. Gait is intact without obvious foot drop.   Psychiatric:        Mood and Affect: Mood normal.        Behavior: Behavior normal.    ED Treatments / Results  Labs (all labs ordered are listed, but only abnormal results are displayed) Labs Reviewed - No data to display  EKG None  Radiology Dg Lumbar Spine Complete  Result Date: 10/21/2018 CLINICAL DATA:  48 year old male with fall 2 weeks ago. Pain radiating to the right leg. EXAM: LUMBAR SPINE - COMPLETE 4+ VIEW COMPARISON:  None. FINDINGS: Normal lumbar segmentation. Straightening of lumbar lordosis. Subtle retrolisthesis of L4 on L5 appears degenerative. Relatively preserved disc spaces. Endplate spurring, mostly in the lower lumbar spine. No pars fracture. Visible lower thoracic levels appear intact. The sacrum and SI joints appear grossly intact. There is mild displacement of the coccyx (image 5). Negative visible abdominal visceral contours. IMPRESSION: No acute osseous  abnormality identified in the lumbar spine. Mild displacement of the coccyx suggesting recent versus remote coccygeal trauma (correlate for tailbone pain). Electronically Signed   By: Odessa Fleming M.D.   On: 10/21/2018 10:25   Dg Hip Unilat With Pelvis 2-3 Views Right  Result Date: 10/21/2018 CLINICAL DATA:  48 year old male with fall 2 weeks ago. Pain radiating to the right leg. EXAM: DG HIP (WITH OR WITHOUT PELVIS) 2-3V RIGHT COMPARISON:  Lumbar radiographs today reported separately. FINDINGS: Bone mineralization is within normal limits. There is no evidence of hip fracture or dislocation. There is no evidence of arthropathy or other focal bone abnormality. Negative lower abdominal and pelvic visceral contours. IMPRESSION: Negative. Electronically Signed   By: Odessa Fleming M.D.   On: 10/21/2018 10:27    Procedures Procedures (including critical care time)  Medications Ordered in ED Medications - No data to display   Initial Impression / Assessment and Plan / ED Course  I  have reviewed the triage vital signs and the nursing notes.  Pertinent labs & imaging results that were available during my care of the patient were reviewed by me and considered in my medical decision making (see chart for details).    Patient presents with complaint of back pain radiating down RLE.  Patient is nontoxic appearing, vitals are WNL other than elevated BP- doubt HTN emergency. Patient has normal neurologic exam, no point/focal midline tenderness to palpation. He is ambulatory in the ED.  No back pain red flags. No urinary sxs. Screening x-rays obtained given new pain in 48 year old patient to check for obvious cancerous process, hip x-ray negative, no acute osseous abnormality identified in the lumbar spine. Mild displacement of the coccyx suggesting recent versus remote coccygeal trauma (correlate for tailbone pain) per radiology- nontender to palpation on exam. Presentation most likely muscle strain versus spasm w/  component of sciatica in setting of heavy lifting at work. Considered disc disease, UTI/pyelonephritis, kidney stone, aortic aneurysm/dissection, cauda equina or epidural abscess however these do not fit clinical picture at this time. Will treat with Naproxen and Robaxin, discussed with patient that they are not to drive or operate heavy machinery while taking Robaxin. I discussed treatment plan, need for PCP follow-up, and return precautions with the patient. Provided opportunity for questions, patient confirmed understanding and is in agreement with plan.   Final Clinical Impressions(s) / ED Diagnoses   Final diagnoses:  Acute right-sided low back pain with right-sided sciatica    ED Discharge Orders         Ordered    methocarbamol (ROBAXIN) 500 MG tablet  Every 8 hours PRN     10/21/18 1031    naproxen (NAPROSYN) 500 MG tablet  2 times daily     10/21/18 [address], Pleas Koch, PA-C 10/21/18 1043    Azalia Bilis, MD 10/22/18 [phone]

## 2018-12-16 ENCOUNTER — Ambulatory Visit (HOSPITAL_COMMUNITY)
Admission: EM | Admit: 2018-12-16 | Discharge: 2018-12-16 | Disposition: A | Discharge status: Home / Self Care | Payer: BC Managed Care – PPO | Attending: Internal Medicine | Admitting: Internal Medicine

## 2018-12-16 ENCOUNTER — Other Ambulatory Visit: Payer: Self-pay

## 2018-12-16 ENCOUNTER — Encounter (HOSPITAL_COMMUNITY): Payer: Self-pay | Admitting: Emergency Medicine

## 2018-12-16 DIAGNOSIS — M5441 Lumbago with sciatica, right side: Secondary | ICD-10-CM

## 2018-12-16 MED ORDER — CYCLOBENZAPRINE HCL 5 MG PO TABS: 5.0000 mg | ORAL_TABLET | Freq: Two times a day (BID) | ORAL | 0 refills | Status: DC | PRN

## 2018-12-16 MED ORDER — PREDNISONE 50 MG PO TABS: 50.0000 mg | ORAL_TABLET | Freq: Every day | ORAL | 0 refills | Status: AC

## 2018-12-16 MED ORDER — IBUPROFEN 800 MG PO TABS: 800.0000 mg | ORAL_TABLET | Freq: Three times a day (TID) | ORAL | 0 refills | Status: DC

## 2018-12-16 NOTE — ED Triage Notes (Signed)
Back pain started April 22.  Patient remains out of work.  Medicine has not helped.    Patient is not answering translator when asked what happened to back

## 2018-12-16 NOTE — Discharge Instructions (Addendum)
Begin prednisone daily for 5 days in the morning with food  After completing prednisone may use Ibuprofen 800 mg every 8 hours with food and Tylenol [phone] mg   You may use flexeril as needed to help with pain. This is a muscle relaxer and causes sedation- please use only at bedtime or when you will be home and not have to drive/work  No heavy lifting  Follow up if not getting better or worsening

## 2018-12-17 NOTE — ED Provider Notes (Signed)
[place]    CSN: [id number] Arrival date & time: 12/16/18  1248      History   Chief Complaint Chief Complaint  Patient presents with   Back Pain    HPI Swahili interpretation via [employer] interpreter Louis Morse is a 48 y.o. male no contributing past medical history presenting today for evaluation of back pain.  Patient states that he has had back pain off and on for the past few years.  He noted that his problems started when he was in [place].  They were treated and resolved, once he began working here at [employer] he began to have back pain again.  He mainly notices it on his right lower side and radiates into his right leg.  Denies any specific injury, but does note at work he does heavy lifting.  He was seen on 10/21/2018 and had x-rays of his lumbar spine and hip performed.  Relatively negative for fracture, showed possible coccygeal injury.  Was prescribed naproxen and Robaxin.  States that helped briefly, but recently his pain has returned.  He has not been able to return back to work due to his pain.  He denies any issues with urination or bowel movements.  He does note that he has issues with erectile dysfunction, but urination has been normal.  Denies incontinence.  Denies numbness in groin region/saddle anesthesia.  Denies weakness in leg.  States that he has difficulty finding a comfortable position.  He has not taken any other medicines since the Naprosyn ran out.  HPI  History reviewed. No pertinent past medical history.  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 No pertinent surgical history.     Home Medications    Prior to Admission medications   Medication Sig Start Date End Date Taking? Authorizing  Provider  cyclobenzaprine (FLEXERIL) 5 MG tablet Take 1-2 tablets (5-10 mg total) by mouth 2 (two) times daily as needed for muscle spasms. 12/16/18   Deborah Dondero C, PA-C  ibuprofen (ADVIL) 800 MG tablet Take 1 tablet (800 mg total) by mouth 3 (three) times daily. 12/16/18   Ruchel Brandenburger C, PA-C  predniSONE (DELTASONE) 50 MG tablet Take 1 tablet (50 mg total) by mouth daily for 5 days. 12/16/18 12/21/18  Mahaila Tischer, Elesa Hacker, PA-C    Family History History reviewed. No pertinent family history.  Social History Social History   Tobacco Use   Smoking status: Never Smoker   Smokeless tobacco: Never Used  Substance Use Topics   Alcohol use: Yes   Drug use: Not Currently     Allergies   Patient has no known allergies.   Review of Systems Review of Systems  Constitutional: Negative for fatigue and fever.  Eyes: Negative for redness, itching and visual disturbance.  Respiratory: Negative for shortness of breath.   Cardiovascular: Negative for chest pain and leg swelling.  Gastrointestinal: Negative for nausea and vomiting.  Musculoskeletal: Positive for back pain, gait problem and myalgias. Negative for arthralgias.  Skin: Negative for color change, rash and wound.  Neurological: Negative for dizziness, syncope, weakness, light-headedness and headaches.     Physical Exam Triage Vital Signs ED Triage Vitals  Enc Vitals Group     BP 12/16/18 1347 133/89     Pulse Rate 12/16/18 1347 73  Resp 12/16/18 1347 18     Temp 12/16/18 1347 98.4 F (36.9 C)     Temp Source 12/16/18 1347 Oral     SpO2 12/16/18 1347 97 %     Weight --      Height --      Head Circumference --      Peak Flow --      Pain Score 12/16/18 1342 7     Pain Loc --      Pain Edu? --      Excl. in GC? --    No data found.  Updated Vital Signs BP 133/89 (BP Location: Left Arm) Comment (BP Location): large cuff   Pulse 73    Temp 98.4 F (36.9 C) (Oral)    Resp 18    SpO2 97%   Visual  Acuity Right Eye Distance:   Left Eye Distance:   Bilateral Distance:    Right Eye Near:   Left Eye Near:    Bilateral Near:     Physical Exam Vitals signs and nursing note reviewed.  Constitutional:      Appearance: He is well-developed.  HENT:     Head: Normocephalic and atraumatic.  Eyes:     Conjunctiva/sclera: Conjunctivae normal.  Neck:     Musculoskeletal: Neck supple.  Cardiovascular:     Rate and Rhythm: Normal rate and regular rhythm.     Heart sounds: No murmur.  Pulmonary:     Effort: Pulmonary effort is normal. No respiratory distress.     Breath sounds: Normal breath sounds.     Comments: Breathing comfortably at rest, CTABL, no wheezing, rales or other adventitious sounds auscultated Abdominal:     Palpations: Abdomen is soft.     Tenderness: There is no abdominal tenderness.  Musculoskeletal:     Comments: Nontender to palpation of cervical, thoracic and lumbar spine midline, increased tenderness throughout bilateral left and right lateral lumbar musculature, more prominent on right, positive straight leg raise on right side Strength at hips and knees 5/5 and equal bilaterally Patellar reflex 2+ bilaterally  Skin:    General: Skin is warm and dry.  Neurological:     Mental Status: He is alert.      UC Treatments / Results  Labs (all labs ordered are listed, but only abnormal results are displayed) Labs Reviewed - No data to display  EKG None  Radiology No results found.  Procedures Procedures (including critical care time)  Medications Ordered in UC Medications - No data to display  Initial Impression / Assessment and Plan / UC Course  I have reviewed the triage vital signs and the nursing notes.  Pertinent labs & imaging results that were available during my care of the patient were reviewed by me and considered in my medical decision making (see chart for details).     Patient with low back pain with radicular distribution.   Previously without full resolution of symptoms on NSAIDs.  Will try prednisone as alternative.  Denies history of diabetes.  Offered injections, but he preferred oral medicine.  Will provide 5 days of prednisone 50 mg with food.  Afterwards may continue with NSAIDs.  Discussed not taking these medicines together to avoid stomach irritation/GI bleed and ulcer.  Also may supplement with Flexeril, discussed drowsiness regarding this and advised not to drive or work after taking.  Provided patient with work note that he may return on Monday and no heavy lifting.  Advised that I cannot write a  letter for him to be out indefinitely.Discussed strict return precautions. Patient verbalized understanding and is agreeable with plan.  Final Clinical Impressions(s) / UC Diagnoses   Final diagnoses:  Acute bilateral low back pain with right-sided sciatica     Discharge Instructions     Begin prednisone daily for 5 days in the morning with food  After completing prednisone may use Ibuprofen 800 mg every 8 hours with food and Tylenol [phone] mg   You may use flexeril as needed to help with pain. This is a muscle relaxer and causes sedation- please use only at bedtime or when you will be home and not have to drive/work  No heavy lifting  Follow up if not getting better or worsening   ED Prescriptions    Medication Sig Dispense Auth. Provider   predniSONE (DELTASONE) 50 MG tablet Take 1 tablet (50 mg total) by mouth daily for 5 days. 5 tablet Ash Mcelwain C, PA-C   cyclobenzaprine (FLEXERIL) 5 MG tablet Take 1-2 tablets (5-10 mg total) by mouth 2 (two) times daily as needed for muscle spasms. 24 tablet Chitara Clonch C, PA-C   ibuprofen (ADVIL) 800 MG tablet Take 1 tablet (800 mg total) by mouth 3 (three) times daily. 30 tablet Aranda Bihm, KenoHallie C, PA-C     Controlled Substance Prescriptions [place] Controlled Substance Registry consulted? Not Applicable   Lew DawesWieters, Ruba Outen C, [place]PA-C 12/17/18 [phone]

## 2019-01-18 ENCOUNTER — Encounter (HOSPITAL_COMMUNITY): Payer: Self-pay | Admitting: Emergency Medicine

## 2019-01-18 ENCOUNTER — Ambulatory Visit (HOSPITAL_COMMUNITY)
Admission: EM | Admit: 2019-01-18 | Discharge: 2019-01-18 | Disposition: A | Discharge status: Home / Self Care | Payer: BC Managed Care – PPO | Attending: Family Medicine | Admitting: Family Medicine

## 2019-01-18 ENCOUNTER — Other Ambulatory Visit: Payer: Self-pay

## 2019-01-18 DIAGNOSIS — M5441 Lumbago with sciatica, right side: Secondary | ICD-10-CM

## 2019-01-18 MED ORDER — CYCLOBENZAPRINE HCL 5 MG PO TABS: 5.0000 mg | ORAL_TABLET | Freq: Two times a day (BID) | ORAL | 0 refills | Status: DC | PRN

## 2019-01-18 MED ORDER — DICLOFENAC SODIUM 50 MG PO TBEC: 50.0000 mg | DELAYED_RELEASE_TABLET | Freq: Two times a day (BID) | ORAL | 0 refills | Status: DC

## 2019-01-18 NOTE — ED Provider Notes (Signed)
[facility]    CSN: [id number] Arrival date & time: 01/18/19  1654      History   Chief Complaint Chief Complaint  Patient presents with  . Letter for School/Work    HPI Louis Morse is a 48 y.o. male presenting today for evaluation of back pain and work clearance.  Patient has had persistent lower back pain that radiates into his right leg.  This is been going on for months and has had multiple evaluations for this.  He has had negative imaging.  He currently works at a [employer] and does a lot of heavy lifting.  He has been out of work due to his back pain.  He is now concerned as he needs to get back to work in order to make a living.  He is using medicine he was given at previous visit.  His return today for a work note to return back to work.  He denies any new changes, new injury.  Denies saddle anesthesia.  Denies changes in urination or bowel movements.  HPI  History reviewed. No pertinent past medical history.  Patient Active Problem List   Diagnosis Date Noted  . URI (upper respiratory infection) 08/06/2016  . 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 HLD (hyperlipidemia) 08/06/2016  . Refugee health examination 07/05/2016  . Language barrier 07/05/2016    History reviewed. No pertinent surgical history.     Home Medications    Prior to Admission medications   Medication Sig Start Date End Date Taking? Authorizing Provider  cyclobenzaprine (FLEXERIL) 5 MG tablet Take 1-2 tablets (5-10 mg total) by mouth 2 (two) times daily as needed for muscle spasms. May cause drowsiness 01/18/19   Lucendia Leard C, PA-C  diclofenac (VOLTAREN) 50 MG EC tablet Take 1 tablet (50 mg total) by mouth 2 (two) times daily. 01/18/19   Ishita Mcnerney, Junius CreamerHallie C, PA-C    Family History History reviewed. No pertinent family history.  Social History Social History   Tobacco Use  . Smoking status: Never Smoker  . Smokeless tobacco: Never Used  Substance Use  Topics  . Alcohol use: Yes  . Drug use: Not Currently     Allergies   Patient has no known allergies.   Review of Systems Review of Systems  Constitutional: Negative for fatigue and fever.  Eyes: Negative for redness, itching and visual disturbance.  Respiratory: Negative for shortness of breath.   Cardiovascular: Negative for chest pain and leg swelling.  Gastrointestinal: Negative for nausea and vomiting.  Genitourinary: Negative for decreased urine volume and difficulty urinating.  Musculoskeletal: Positive for back pain and myalgias. Negative for arthralgias.  Skin: Negative for color change, rash and wound.  Neurological: Negative for dizziness, syncope, weakness, light-headedness and headaches.     Physical Exam Triage Vital Signs ED Triage Vitals [01/18/19 1815]  Enc Vitals Group     BP 127/86     Pulse Rate 86     Resp 18     Temp 98.3 F (36.8 C)     Temp Source Oral     SpO2 97 %     Weight      Height      Head Circumference      Peak Flow      Pain Score 8     Pain Loc      Pain Edu?      Excl. in GC?    No data found.  Updated Vital Signs BP 127/86 (BP Location:  Right Arm)   Pulse 86   Temp 98.3 F (36.8 C) (Oral)   Resp 18   SpO2 97%   Visual Acuity Right Eye Distance:   Left Eye Distance:   Bilateral Distance:    Right Eye Near:   Left Eye Near:    Bilateral Near:     Physical Exam Vitals signs and nursing note reviewed.  Constitutional:      Appearance: He is well-developed.  HENT:     Head: Normocephalic and atraumatic.  Eyes:     Conjunctiva/sclera: Conjunctivae normal.  Neck:     Musculoskeletal: Neck supple.  Cardiovascular:     Rate and Rhythm: Normal rate and regular rhythm.     Heart sounds: No murmur.  Pulmonary:     Effort: Pulmonary effort is normal. No respiratory distress.     Breath sounds: Normal breath sounds.  Abdominal:     Palpations: Abdomen is soft.     Tenderness: There is no abdominal tenderness.   Musculoskeletal:     Comments: Tenderness to palpation of bilateral lower lumbar regions, nontender midline Strength 5/5 and equal bilaterally at hips and knees, patellar reflex 1+ bilaterally  Skin:    General: Skin is warm and dry.  Neurological:     Mental Status: He is alert.      UC Treatments / Results  Labs (all labs ordered are listed, but only abnormal results are displayed) Labs Reviewed - No data to display  EKG   Radiology No results found.  Procedures Procedures (including critical care time)  Medications Ordered in UC Medications - No data to display  Initial Impression / Assessment and Plan / UC Course  I have reviewed the triage vital signs and the nursing notes.  Pertinent labs & imaging results that were available during my care of the patient were reviewed by me and considered in my medical decision making (see chart for details).     Patient with continued lower back pain.  Will refill Flexeril for use at home.  Provided work note to return with no heavy lifting.  Advised to discuss with job potential change in position that avoids heavy lifting.  Discussed given patient's symptoms persisting, may benefit from physical therapy or treatment from orthopedic/specialist.  Will try diclofenac as alternative today.  No red flags for cauda equina.  Discussed strict return precautions. Patient verbalized understanding and is agreeable with plan.  Final Clinical Impressions(s) / UC Diagnoses   Final diagnoses:  Acute right-sided low back pain with right-sided sciatica     Discharge Instructions     Diclofenac twice daily- may take and work  You may use flexeril as needed to help with pain. This is a muscle relaxer and causes sedation- please use only at bedtime or when you will be home and not have to drive/work  Alternate ice and heat   ED Prescriptions    Medication Sig Dispense Auth. Provider   cyclobenzaprine (FLEXERIL) 5 MG tablet Take 1-2  tablets (5-10 mg total) by mouth 2 (two) times daily as needed for muscle spasms. May cause drowsiness 24 tablet Aurie Harroun C, PA-C   diclofenac (VOLTAREN) 50 MG EC tablet Take 1 tablet (50 mg total) by mouth 2 (two) times daily. 30 tablet Kamla Skilton, [place] C, PA-C     Controlled Substance Prescriptions [place] Controlled Substance Registry consulted? Not Applicable   Janith Lima, [place] 01/19/19 [phone]

## 2019-01-18 NOTE — Discharge Instructions (Addendum)
Diclofenac twice daily- may take and work  You may use flexeril as needed to help with pain. This is a muscle relaxer and causes sedation- please use only at bedtime or when you will be home and not have to drive/work  Alternate ice and heat

## 2019-01-18 NOTE — ED Triage Notes (Signed)
Pt sts hx of back pain and still having some pain but would like note to return to work

## 2020-06-07 ENCOUNTER — Other Ambulatory Visit: Payer: Self-pay

## 2020-06-07 ENCOUNTER — Ambulatory Visit (HOSPITAL_COMMUNITY)
Admission: EM | Admit: 2020-06-07 | Discharge: 2020-06-07 | Disposition: A | Discharge status: Home / Self Care | Payer: BC Managed Care – PPO

## 2020-06-07 ENCOUNTER — Encounter (HOSPITAL_COMMUNITY): Payer: Self-pay

## 2020-06-07 DIAGNOSIS — H539 Unspecified visual disturbance: Secondary | ICD-10-CM

## 2020-06-07 NOTE — Discharge Instructions (Signed)
Make an appointment with an OPTOMETRIST or OPHTHALMOLOGIST. They can prescribe corrective lenses (glasses), and complete your form.

## 2020-06-07 NOTE — ED Triage Notes (Signed)
Pt present with daughter. Daughter is translating, pt confirmed being comfortable.   Pt presents with right eye pain when looking up and looking at his phone or tv screen. Pt denies blurry vision. Pt states he has to get close to an object to see well. Pt states when he went to the [facility] for driving license, the pt could not see small objects. Pt states he was given a form for a physician to fill out stating his vision is good and okay for him to operate a motor vehicle.

## 2020-06-07 NOTE — ED Provider Notes (Addendum)
[facility]    CSN: [id number] Arrival date & time: 06/07/20  1040      History   Chief Complaint Chief Complaint  Patient presents with  . Eye Pain    HPI Louis Morse is a 49 y.o. male presenting with blurry vision, unchanged for years. Pt states that he went to the [facility] for his driving license, but failed the vision test there. They gave him a form to fill out that requires optometrist or ophthalmologist signature, and so pt and his daughter presented to this urgent care. He has never worn corrective lenses in the past. Denies eye pain, denies eye injury, denies recent vision changes, denies systemic sx/fevers/chills/etc.  HPI  History reviewed. No pertinent past medical history.  Patient Active Problem List   Diagnosis Date Noted  . URI (upper respiratory infection) 08/06/2016  . 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 HLD (hyperlipidemia) 08/06/2016  . Refugee health examination 07/05/2016  . Language barrier 07/05/2016    History reviewed. No pertinent surgical history.     Home Medications    Prior to Admission medications   Medication Sig Start Date End Date Taking? Authorizing Provider  cyclobenzaprine (FLEXERIL) 5 MG tablet Take 1-2 tablets (5-10 mg total) by mouth 2 (two) times daily as needed for muscle spasms. May cause drowsiness 01/18/19   Wieters, Hallie C, PA-C  diclofenac (VOLTAREN) 50 MG EC tablet Take 1 tablet (50 mg total) by mouth 2 (two) times daily. 01/18/19   Wieters, Junius Creamer, PA-C    Family History History reviewed. No pertinent family history.  Social History Social History   Tobacco Use  . Smoking status: Never Smoker  . Smokeless tobacco: Never Used  Vaping Use  . Vaping Use: Never used  Substance Use Topics  . Alcohol use: Yes  . Drug use: Not Currently     Allergies   Patient has no known allergies.   Review of Systems Review of Systems  Constitutional: Negative.  Negative for chills and fever.   HENT: Negative.  Negative for congestion, ear pain and sinus pain.   Eyes: Negative.  Negative for pain, discharge, redness, itching and visual disturbance.  All other systems reviewed and are negative.    Physical Exam Triage Vital Signs ED Triage Vitals  Enc Vitals Group     BP 06/07/20 1155 123/80     Pulse Rate 06/07/20 1155 67     Resp 06/07/20 1155 15     Temp 06/07/20 1155 98.1 F (36.7 C)     Temp Source 06/07/20 1155 Oral     SpO2 06/07/20 1155 100 %     Weight --      Height --      Head Circumference --      Peak Flow --      Pain Score 06/07/20 1152 6     Pain Loc --      Pain Edu? --      Excl. in GC? --    No data found.  Updated Vital Signs BP 123/80 (BP Location: Left Arm)   Pulse 67   Temp 98.1 F (36.7 C) (Oral)   Resp 15   SpO2 100%   Visual Acuity Right Eye Distance: 20/40 Left Eye Distance: 20/70 Bilateral Distance: 20/30  Right Eye Near:   Left Eye Near:    Bilateral Near:     Physical Exam Vitals reviewed.  Constitutional:      Appearance: Normal appearance. He is normal weight.  HENT:     Head: Normocephalic and atraumatic.     Right Ear: Tympanic membrane, ear canal and external ear normal. There is no impacted cerumen.     Left Ear: Tympanic membrane, ear canal and external ear normal. There is no impacted cerumen.     Nose: Nose normal.  Eyes:     General: Lids are normal. Gaze aligned appropriately. No scleral icterus.       Right eye: No foreign body, discharge or hordeolum.        Left eye: No foreign body, discharge or hordeolum.     Extraocular Movements: Extraocular movements intact.     Conjunctiva/sclera: Conjunctivae normal.     Right eye: Right conjunctiva is not injected.     Left eye: Left conjunctiva is not injected.     Pupils: Pupils are equal, round, and reactive to light.  Neurological:     Mental Status: He is alert.      UC Treatments / Results  Labs (all labs ordered are listed, but only abnormal  results are displayed) Labs Reviewed - No data to display  EKG   Radiology No results found.  Procedures Procedures (including critical care time)  Medications Ordered in UC Medications - No data to display  Initial Impression / Assessment and Plan / UC Course  I have reviewed the triage vital signs and the nursing notes.  Pertinent labs & imaging results that were available during my care of the patient were reviewed by me and considered in my medical decision making (see chart for details).     Explained to patient that his form must be filled out by an optometrist or ophthalmologist, and that I cannot fill this out. Advised that they may require him to wear corrective lenses for this form to be completed, given distance vision of 20/40. Patient and daughter verbalize understanding. Information about local optometrist and ophthalmologist was provided.   Final Clinical Impressions(s) / UC Diagnoses   Final diagnoses:  Visual changes     Discharge Instructions     Make an appointment with an OPTOMETRIST or OPHTHALMOLOGIST. They can prescribe corrective lenses (glasses), and complete your form.     ED Prescriptions    None     PDMP not reviewed this encounter.   Rhys Martini, PA-C 06/07/20 1249    Rhys Martini, PA-C 06/07/20 1453

## 2021-03-30 ENCOUNTER — Other Ambulatory Visit: Payer: Self-pay

## 2021-03-30 ENCOUNTER — Ambulatory Visit (HOSPITAL_COMMUNITY)
Admission: EM | Admit: 2021-03-30 | Discharge: 2021-03-30 | Disposition: A | Discharge status: Home / Self Care | Payer: Self-pay

## 2021-03-30 ENCOUNTER — Encounter (HOSPITAL_COMMUNITY): Payer: Self-pay

## 2021-03-30 DIAGNOSIS — M545 Low back pain, unspecified: Secondary | ICD-10-CM

## 2021-03-30 DIAGNOSIS — B349 Viral infection, unspecified: Secondary | ICD-10-CM

## 2021-03-30 NOTE — ED Triage Notes (Signed)
Pt presents with headache, coughing and SOB X 2 days.   Pt states he started having back pain since this morning. Sates he has been taking Ibuprofen that has not helped.

## 2021-03-30 NOTE — ED Provider Notes (Signed)
[facility]    CSN: [id number] Arrival date & time: 03/30/21  1239      History   Chief Complaint Chief Complaint  Patient presents with   Cough   Headache   Fever   Back Pain    HPI Louis Morse is a 50 y.o. male.   Patient here for evaluation of headache, coughing, and congestion that has been ongoing for the past 2 days.  Also reports lower back pain.  Reports back pain is chronic but worsening over the past several days.  Does report having to do heavy lifting at work.  Reports taking ibuprofen with minimal symptom relief.  Denies any recent sick contacts.  Reports COVID testing at work and was negative.  Denies any trauma, injury, or other precipitating event.  Denies any fevers, chest pain, N/V/D, numbness, tingling, weakness, abdominal pain.    The history is provided by the patient.  Cough Associated symptoms: fever and headaches   Associated symptoms: no chest pain and no shortness of breath   Headache Associated symptoms: back pain, congestion, cough and fever   Fever Associated symptoms: congestion, cough and headaches   Associated symptoms: no chest pain   Back Pain Associated symptoms: fever and headaches   Associated symptoms: no chest pain    History reviewed. No pertinent past medical history.  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 No pertinent surgical history.     Home Medications    Prior to Admission medications   Medication Sig Start Date End Date Taking? Authorizing Provider  cyclobenzaprine (FLEXERIL) 5 MG tablet Take 1-2 tablets (5-10 mg total) by mouth 2 (two) times daily as needed for muscle spasms. May cause drowsiness 01/18/19   Wieters, Hallie C, PA-C  diclofenac (VOLTAREN) 50 MG EC  tablet Take 1 tablet (50 mg total) by mouth 2 (two) times daily. 01/18/19   Wieters, Junius Creamer, PA-C    Family History History reviewed. No pertinent family history.  Social History Social History   Tobacco Use   Smoking status: Never   Smokeless tobacco: Never  Vaping Use   Vaping Use: Never used  Substance Use Topics   Alcohol use: Yes   Drug use: Not Currently     Allergies   Patient has no known allergies.   Review of Systems Review of Systems  Constitutional:  Positive for fever.  HENT:  Positive for congestion.   Respiratory:  Positive for cough. Negative for shortness of breath.   Cardiovascular:  Negative for chest pain.  Musculoskeletal:  Positive for back pain.  Neurological:  Positive for headaches.  All other systems reviewed and are negative.   Physical Exam Triage Vital Signs ED Triage Vitals  Enc Vitals Group     BP 03/30/21 1301 119/82     Pulse Rate 03/30/21 1301 76     Resp --      Temp --      Temp src --      SpO2 03/30/21 1301 96 %     Weight --      Height --      Head Circumference --      Peak Flow --      Pain Score 03/30/21 1307 4     Pain Loc --  Pain Edu? --      Excl. in [place]? --    No data found.  Updated Vital Signs BP 119/82 (BP Location: Right Arm)   Pulse 76   SpO2 96%   Visual Acuity Right Eye Distance:   Left Eye Distance:   Bilateral Distance:    Right Eye Near:   Left Eye Near:    Bilateral Near:     Physical Exam Vitals and nursing note reviewed.  Constitutional:      General: He is not in acute distress.    Appearance: Normal appearance. He is not ill-appearing, toxic-appearing or diaphoretic.  HENT:     Head: Normocephalic and atraumatic.     Right Ear: Tympanic membrane, ear canal and external ear normal.     Left Ear: Tympanic membrane, ear canal and external ear normal.     Nose: Nose normal. No congestion.     Mouth/Throat:     Pharynx: No oropharyngeal exudate or posterior oropharyngeal  erythema.  Eyes:     Extraocular Movements: Extraocular movements intact.     Conjunctiva/sclera: Conjunctivae normal.     Pupils: Pupils are equal, round, and reactive to light.  Cardiovascular:     Rate and Rhythm: Normal rate.     Pulses: Normal pulses.     Heart sounds: Normal heart sounds.  Pulmonary:     Effort: Pulmonary effort is normal.     Breath sounds: Normal breath sounds.  Abdominal:     General: Abdomen is flat.     Palpations: Abdomen is soft.  Musculoskeletal:        General: Normal range of motion.     Cervical back: Normal range of motion.  Skin:    General: Skin is warm and dry.  Neurological:     General: No focal deficit present.     Mental Status: He is alert and oriented to person, place, and time.     GCS: GCS eye subscore is 4. GCS verbal subscore is 5. GCS motor subscore is 6.     Cranial Nerves: No cranial nerve deficit.     Motor: No weakness.     Coordination: Coordination normal.     Gait: Gait normal.  Psychiatric:        Mood and Affect: Mood normal.     UC Treatments / Results  Labs (all labs ordered are listed, but only abnormal results are displayed) Labs Reviewed - No data to display  EKG   Radiology No results found.  Procedures Procedures (including critical care time)  Medications Ordered in UC Medications - No data to display  Initial Impression / Assessment and Plan / UC Course  I have reviewed the triage vital signs and the nursing notes.  Pertinent labs & imaging results that were available during my care of the patient were reviewed by me and considered in my medical decision making (see chart for details).    Assessment negative for red flags or concerns.  Likely a viral illness.  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  Tylenol and or ibuprofen as needed.  Discussed conservative symptom management as described in discharge instructions.  Encourage fluids and rest. Lower  back pain without sciatica.  Tylenol and/or ibuprofen as needed.  May use heat, ice, or alternate between heat and ice.  Discussed conservative symptom management including OTC medications.  Given instructions on back injury prevention.  Follow up with primary care for re-evaluation.  Final Clinical Impressions(s) / UC Diagnoses   Final diagnoses:  Viral illness  Midline low back pain without sciatica, unspecified chronicity     Discharge Instructions      You can take Tylenol and/or Ibuprofen as needed for fever reduction and pain relief.   For cough: honey 1/2 to 1 teaspoon (you can dilute the honey in water or another fluid).  You can also use guaifenesin and dextromethorphan for cough. You can use a humidifier for chest congestion and cough.  If you don't have a humidifier, you can sit in the bathroom with the hot shower running.    For sore throat: try warm salt water gargles, cepacol lozenges, throat spray, warm tea or water with lemon/honey, popsicles or ice, or OTC cold relief medicine for throat discomfort.  For congestion: take a daily anti-histamine like Zyrtec, Claritin, and a oral decongestant, such as pseudoephedrine.  You can also use Flonase 1-2 sprays in each nostril daily.    It is important to stay hydrated: drink plenty of fluids (water, gatorade/powerade/pedialyte, juices, or teas) to keep your throat moisturized and help further relieve irritation/discomfort.   For your back pain, you can use heat, ice, or alternate between heat and ice.  You can also try IcyHot, lidocaine patches, Biofreeze, Aspercreme, or Voltaren gel as needed for pain relief.  Return or go to the Emergency Department if symptoms worsen or do not improve in the next few days.      ED Prescriptions   None    PDMP not reviewed this encounter.   Ivette Loyal, NP 03/30/21 1349

## 2021-03-30 NOTE — Discharge Instructions (Addendum)
You can take Tylenol and/or Ibuprofen as needed for fever reduction and pain relief.   For cough: honey 1/2 to 1 teaspoon (you can dilute the honey in water or another fluid).  You can also use guaifenesin and dextromethorphan for cough. You can use a humidifier for chest congestion and cough.  If you don't have a humidifier, you can sit in the bathroom with the hot shower running.    For sore throat: try warm salt water gargles, cepacol lozenges, throat spray, warm tea or water with lemon/honey, popsicles or ice, or OTC cold relief medicine for throat discomfort.  For congestion: take a daily anti-histamine like Zyrtec, Claritin, and a oral decongestant, such as pseudoephedrine.  You can also use Flonase 1-2 sprays in each nostril daily.    It is important to stay hydrated: drink plenty of fluids (water, gatorade/powerade/pedialyte, juices, or teas) to keep your throat moisturized and help further relieve irritation/discomfort.   For your back pain, you can use heat, ice, or alternate between heat and ice.  You can also try IcyHot, lidocaine patches, Biofreeze, Aspercreme, or Voltaren gel as needed for pain relief.  Return or go to the Emergency Department if symptoms worsen or do not improve in the next few days.

## 2021-07-30 ENCOUNTER — Ambulatory Visit (INDEPENDENT_AMBULATORY_CARE_PROVIDER_SITE_OTHER): Payer: BC Managed Care – PPO

## 2021-07-30 ENCOUNTER — Encounter (HOSPITAL_COMMUNITY): Payer: Self-pay | Admitting: Emergency Medicine

## 2021-07-30 ENCOUNTER — Ambulatory Visit (HOSPITAL_COMMUNITY)
Admission: EM | Admit: 2021-07-30 | Discharge: 2021-07-30 | Disposition: A | Discharge status: Home / Self Care | Payer: BC Managed Care – PPO | Attending: Sports Medicine | Admitting: Sports Medicine

## 2021-07-30 ENCOUNTER — Other Ambulatory Visit: Payer: Self-pay

## 2021-07-30 DIAGNOSIS — M25511 Pain in right shoulder: Secondary | ICD-10-CM

## 2021-07-30 MED ORDER — MELOXICAM 15 MG PO TABS: 15.0000 mg | ORAL_TABLET | Freq: Every day | ORAL | 0 refills | Status: DC

## 2021-07-30 NOTE — ED Triage Notes (Signed)
Pt presents with c/o right shoulder pain x 1 month. Reports pain has been progressively getting worse. Denies any obvious injuries or heavy lifting.

## 2021-07-30 NOTE — ED Provider Notes (Signed)
[place]    CSN: [id number] Arrival date & time: 07/30/21  1534      History   Chief Complaint Chief Complaint  Patient presents with   Shoulder Pain    HPI Louis Morse is a 51 y.o. male here for right shoulder pain.   Shoulder Pain  iPad interpreter used throughout the entirety of the visit.  Patient describes right anterolateral shoulder pain x1 month.  He denies any known injury or inciting event.  It has progressively been getting worse.  He feels the pain on the lateral side of the shoulder that will radiate into the proximal deltoid.  He denies any numbness tingling or radiation of pain into his hands.  Denies any weakness or loss of grip strength.  He has not taken any medication for the pain.  His pain is worse with certain activities such as reaching or going overhead.  He denies any swelling, erythema or ecchymosis.  Denies any prior injury to this shoulder.  No neck pain.  History reviewed. No pertinent past medical history.  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 No pertinent surgical history.     Home Medications    Prior to Admission medications   Medication Sig Start Date End Date Taking? Authorizing Provider  meloxicam (MOBIC) 15 MG tablet Take 1 tablet (15 mg total) by mouth daily. 07/30/21  Yes Elba Barman, DO  cyclobenzaprine (FLEXERIL) 5 MG tablet Take 1-2 tablets (5-10 mg total) by mouth 2 (two) times daily as needed for muscle spasms. May cause drowsiness 01/18/19   Wieters, [place] C, PA-C    Family History No family history on file.  Social History Social History   Tobacco Use   Smoking status: Never   Smokeless tobacco: Never  Vaping Use   Vaping Use: Never used  Substance Use Topics    Alcohol use: Yes   Drug use: Not Currently     Allergies   Patient has no known allergies.   Review of Systems Review of Systems  Constitutional:  Negative for activity change.  Musculoskeletal:  Positive for arthralgias (right shoulder).  Skin:  Negative for color change, rash and wound.    Physical Exam Triage Vital Signs ED Triage Vitals  Enc Vitals Group     BP 07/30/21 1656 (!) 148/91     Pulse Rate 07/30/21 1656 83     Resp 07/30/21 1656 18     Temp 07/30/21 1656 98.9 F (37.2 C)     Temp Source 07/30/21 1656 Oral     SpO2 07/30/21 1656 95 %     Weight 07/30/21 1654 154 lb 5.2 oz (70 kg)     Height 07/30/21 1654 5\' 9"  (1.753 m)     Head Circumference --      Peak Flow --      Pain Score 07/30/21 1653 6     Pain Loc --      Pain Edu? --      Excl. in [place]? --    No data found.  Updated Vital Signs BP (!) 148/91 (BP Location: Left Arm)    Pulse 83    Temp 98.9 F (37.2 C) (Oral)    Resp 18    Ht 5\' 9"  (1.753 m)    Wt  70 kg    SpO2 95%    BMI 22.79 kg/m    Physical Exam Gen: Well-appearing, in no acute distress; non-toxic CV: Regular Rate. Well-perfused. Warm.  Resp: Breathing unlabored on room air; no wheezing. Psych: Fluid speech in conversation; appropriate affect; normal thought process Neuro: Sensation intact throughout. No gross coordination deficits.  MSK:  - Right shoulder: No specific TTP over acromion, clavicle or scapula.  Inspection yields no erythema, ecchymosis or swelling.  There is full range of motion in all directions.  There is pain with resisted external rotation. + Empty can test, drop arm test.  Strength 5/5 throughout.  Neurovascular intact distally   UC Treatments / Results  Labs (all labs ordered are listed, but only abnormal results are displayed) Labs Reviewed - No data to display  EKG   Radiology DG Shoulder Right  Result Date: 07/30/2021 CLINICAL DATA:  Right shoulder pain. EXAM: RIGHT SHOULDER - 2+ VIEW COMPARISON:  None.  FINDINGS: There is no evidence for acute fracture or dislocation. There are mild degenerative changes of the acromioclavicular joint with joint space narrowing and osteophyte formation. Soft tissues are within normal limits. IMPRESSION: 1. No acute bony abnormality. Electronically Signed   By: Ronney Asters M.D.   On: 07/30/2021 17:20    Procedures Procedures (including critical care time)  Medications Ordered in UC Medications - No data to display  Initial Impression / Assessment and Plan / UC Course  I have reviewed the triage vital signs and the nursing notes.  Pertinent labs & imaging results that were available during my care of the patient were reviewed by me and considered in my medical decision making (see chart for details).     Right shoulder pain   Insidious onset of right shoulder pain which based on history and exam the seems most indicative of rotator cuff pathology. X-ray of the right shoulder was obtained today and demonstrated only mild AC joint arthritis without any acute fracture or bony abnormalities.  Will write a short prescription for meloxicam 15mg  to be taken once daily with food.  Counseled him on only taking this for the next 5 to 7 days, then as needed and to monitor his blood pressure when he takes this.  Did provide home rehab exercises for the rotator cuff.  Will provide orthopedic information for further long-term management of this.  All questions answered, return precautions provided. Patient safe for discharge home. Final Clinical Impressions(s) / UC Diagnoses   Final diagnoses:  Right shoulder pain, unspecified chronicity   Discharge Instructions   None    ED Prescriptions     Medication Sig Dispense Auth. Provider   meloxicam (MOBIC) 15 MG tablet Take 1 tablet (15 mg total) by mouth daily. 30 tablet Elba Barman, DO      PDMP not reviewed this encounter.   Elba Barman, DO 07/30/21 1755

## 2022-06-09 IMAGING — DX DG SHOULDER 2+V*R*
4 series · 4 of 4 positions shown · non-contrast
Comparison: None.

CLINICAL DATA: Right shoulder pain.

EXAM:
RIGHT SHOULDER - 2+ VIEW

[shoulder ap]
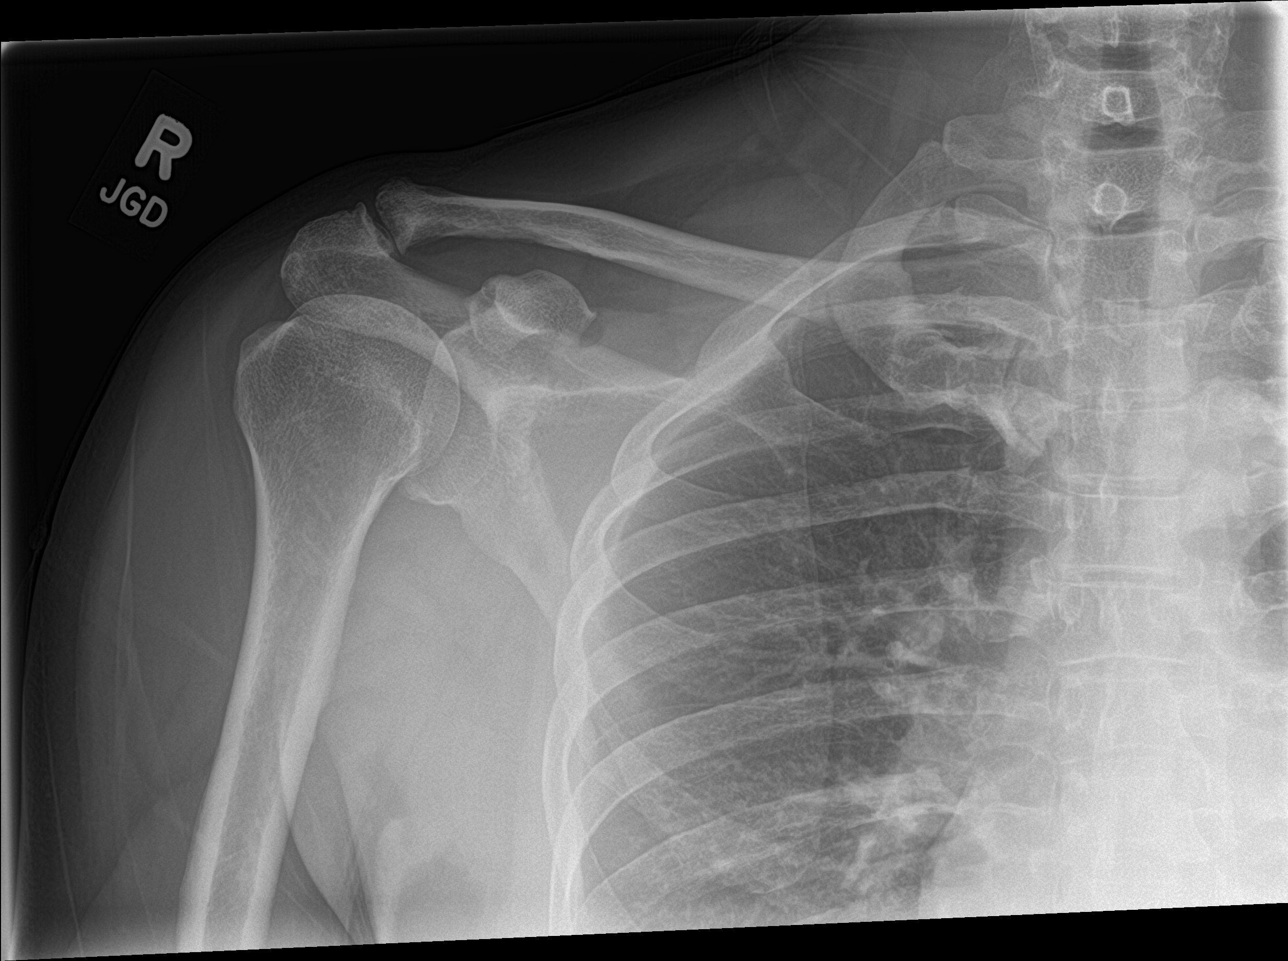

[shoulder grashey]
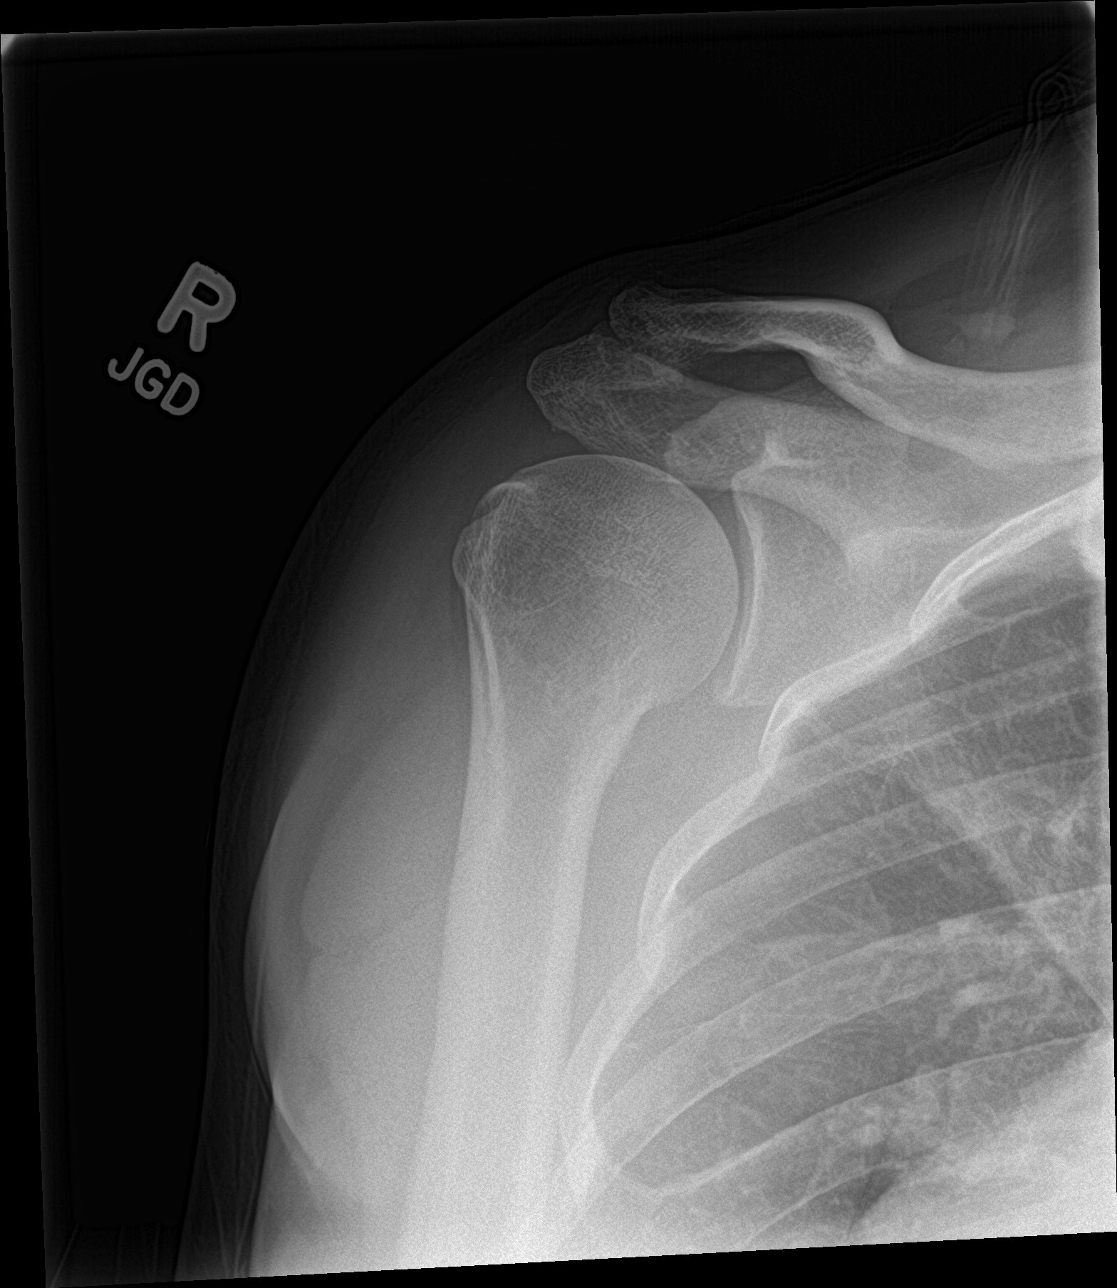

[shoulder y-view]
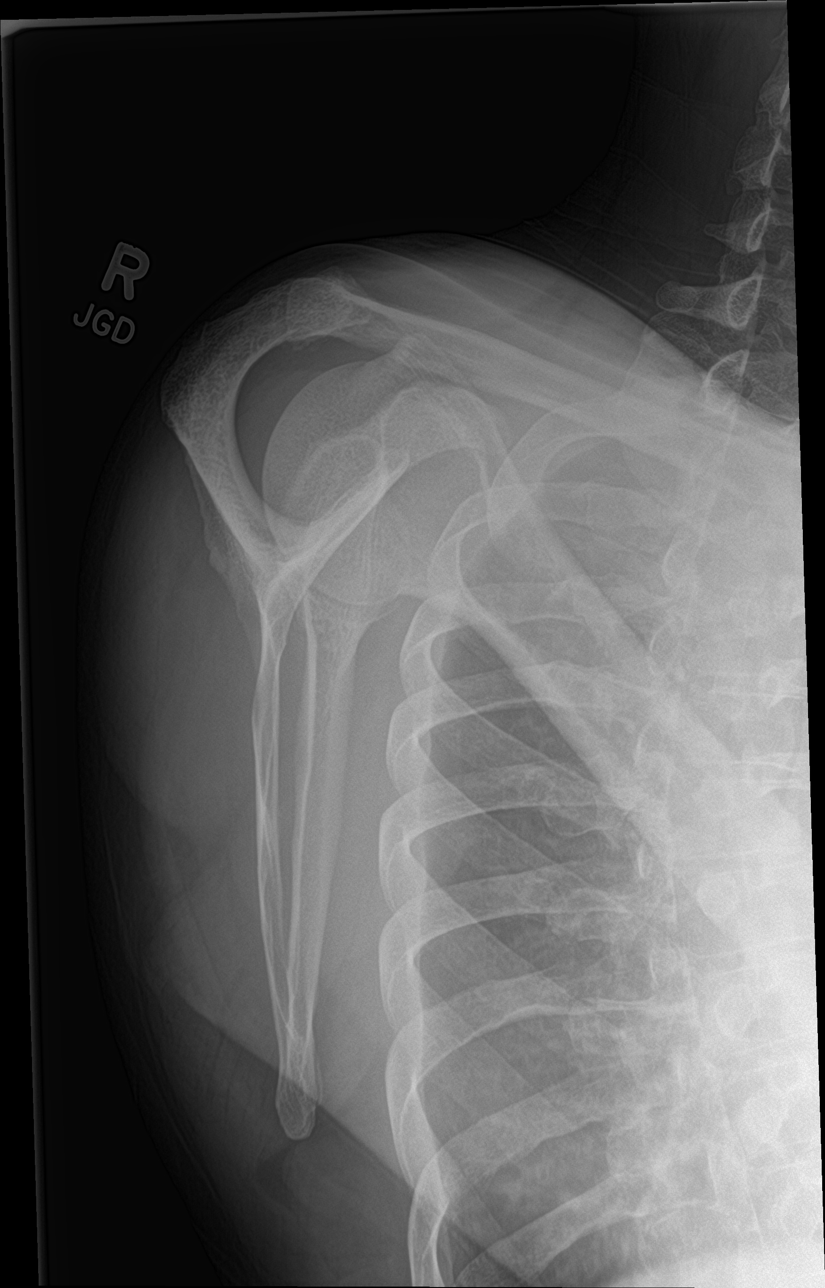

[shoulder axial]
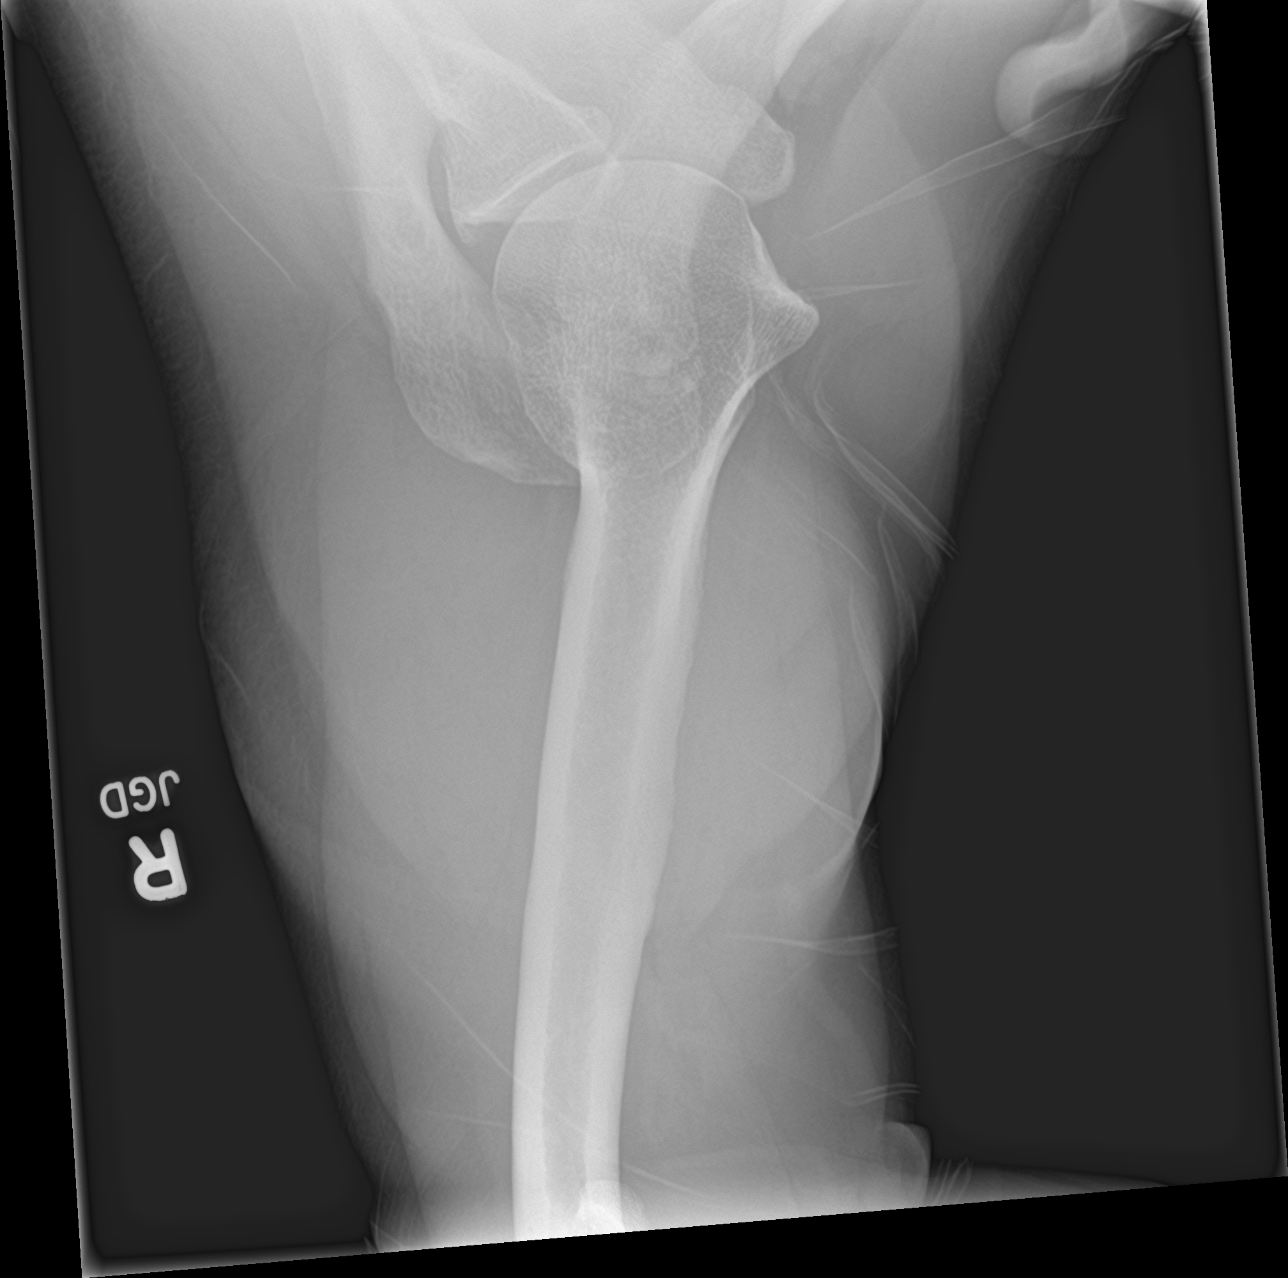

[4 of 4 positions shown; findings below may reference images not displayed]

FINDINGS: There is no evidence for acute fracture or dislocation. There are
mild degenerative changes of the acromioclavicular joint with joint
space narrowing and osteophyte formation. Soft tissues are within
normal limits.
IMPRESSION: 1. No acute bony abnormality.

## 2023-05-24 ENCOUNTER — Ambulatory Visit (HOSPITAL_COMMUNITY)
Admission: EM | Admit: 2023-05-24 | Discharge: 2023-05-24 | Disposition: A | Discharge status: Home / Self Care | Payer: BC Managed Care – PPO | Attending: Internal Medicine | Admitting: Internal Medicine

## 2023-05-24 ENCOUNTER — Encounter (HOSPITAL_COMMUNITY): Payer: Self-pay

## 2023-05-24 DIAGNOSIS — M545 Low back pain, unspecified: Secondary | ICD-10-CM | POA: Diagnosis not present

## 2023-05-24 DIAGNOSIS — M7711 Lateral epicondylitis, right elbow: Secondary | ICD-10-CM

## 2023-05-24 LAB — POCT URINALYSIS DIP (MANUAL ENTRY)
Bilirubin, UA: NEGATIVE
Blood, UA: NEGATIVE
Glucose, UA: NEGATIVE mg/dL
Ketones, POC UA: NEGATIVE mg/dL
Leukocytes, UA: NEGATIVE
Nitrite, UA: NEGATIVE
Protein Ur, POC: NEGATIVE mg/dL
Spec Grav, UA: 1.015 (ref 1.010–1.025)
Urobilinogen, UA: 1 U/dL
pH, UA: 7.5 (ref 5.0–8.0)

## 2023-05-24 MED ORDER — CYCLOBENZAPRINE HCL 5 MG PO TABS: 5.0000 mg | ORAL_TABLET | Freq: Three times a day (TID) | ORAL | 0 refills | Status: DC | PRN

## 2023-05-24 MED ORDER — KETOROLAC TROMETHAMINE 30 MG/ML IJ SOLN
30.0000 mg | Freq: Once | INTRAMUSCULAR | Status: AC
  Administered 2023-05-24: 30 mg via INTRAMUSCULAR

## 2023-05-24 MED ORDER — KETOROLAC TROMETHAMINE 30 MG/ML IJ SOLN
INTRAMUSCULAR | Status: AC
  Filled 2023-05-24: qty 1

## 2023-05-24 MED ORDER — DEXAMETHASONE SODIUM PHOSPHATE 10 MG/ML IJ SOLN
10.0000 mg | Freq: Once | INTRAMUSCULAR | Status: AC
  Administered 2023-05-24: 10 mg via INTRAMUSCULAR

## 2023-05-24 MED ORDER — DEXAMETHASONE SODIUM PHOSPHATE 10 MG/ML IJ SOLN
INTRAMUSCULAR | Status: AC
  Filled 2023-05-24: qty 1

## 2023-05-24 NOTE — Discharge Instructions (Addendum)
Mid back lower back pain. Urinalysis done today due to the dark urine does not show an infection. Right elbow pain likely due to right lateral epicondyle tendinitis.  Will treat all with the following: Toradol injection given for pain Decadron injection given for inflammation Prescription for Flexeril 5 mg 3 times daily as needed for muscle spasms.  Use caution as this can cause drowsiness. Return to urgent care or PCP if symptoms worsen or fail to resolve.

## 2023-05-24 NOTE — ED Provider Notes (Signed)
[facility]    CSN: [id number] Arrival date & time: 05/24/23  1110      History   Chief Complaint Chief Complaint  Patient presents with   Back Pain   Elbow Pain    HPI Louis Morse is a 52 y.o. male.   52 year old male who presents to urgent care with complaints of mid lower back pain and different color urine. This has been going on for about 5 days. He also notes very dark yellow urine along with this symptom. The pain is worse with activity as well. Also noticed his penis is not as "strong" with symptoms but this seems to be due to the back hurting during intercourse. Sometimes he feels like he has some pain in his legs as well but this is more due to the veins in his leg.   Wife recently came to hospital with similar symptoms and was treated with a medication but he does not know what it was for.   Also relates right lateral elbow tenderness with movement.  This has been going on for about 3 weeks.  He does not recall any injury to the area.  It is point tenderness along the right lateral condyle.   Back Pain Associated symptoms: no abdominal pain, no chest pain, no dysuria and no fever     History reviewed. No pertinent past medical history.  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 No pertinent surgical history.     Home Medications    Prior to Admission medications   Medication Sig Start Date End Date Taking? Authorizing Provider  cyclobenzaprine (FLEXERIL) 5 MG tablet Take 1-2 tablets (5-10 mg total) by mouth 2 (two) times daily as needed for muscle spasms. May cause drowsiness 01/18/19   Wieters, Hallie C, PA-C  meloxicam (MOBIC) 15 MG tablet Take 1 tablet (15 mg total) by mouth daily. 07/30/21   Madelyn Brunner, DO     Family History History reviewed. No pertinent family history.  Social History Social History   Tobacco Use   Smoking status: Never   Smokeless tobacco: Never  Vaping Use   Vaping status: Never Used  Substance Use Topics   Alcohol use: Yes   Drug use: Not Currently     Allergies   Patient has no known allergies.   Review of Systems Review of Systems  Constitutional:  Negative for chills and fever.  HENT:  Negative for ear pain and sore throat.   Eyes:  Negative for pain and visual disturbance.  Respiratory:  Negative for cough and shortness of breath.   Cardiovascular:  Negative for chest pain and palpitations.  Gastrointestinal:  Negative for abdominal pain and vomiting.  Genitourinary:  Negative for dysuria and hematuria.  Musculoskeletal:  Positive for back pain. Negative for arthralgias.  Skin:  Negative for color change and rash.  Neurological:  Negative for seizures and syncope.  All other systems reviewed and are negative.    Physical Exam Triage Vital Signs ED Triage Vitals  Encounter Vitals Group     BP 05/24/23 1220 128/84     Systolic BP Percentile --      Diastolic BP Percentile --      Pulse Rate 05/24/23 1220 67     Resp 05/24/23 1220 18  Temp 05/24/23 1220 98.5 F (36.9 C)     Temp src --      SpO2 05/24/23 1220 95 %     Weight 05/24/23 1220 154 lb 5.2 oz (70 kg)     Height 05/24/23 1220 5\' 9"  (1.753 m)     Head Circumference --      Peak Flow --      Pain Score 05/24/23 1214 8     Pain Loc --      Pain Education --      Exclude from Growth Chart --    No data found.  Updated Vital Signs BP 128/84 (BP Location: Right Arm)   Pulse 67   Temp 98.5 F (36.9 C)   Resp 18   Ht 5\' 9"  (1.753 m)   Wt 154 lb 5.2 oz (70 kg)   SpO2 95%   BMI 22.79 kg/m   Visual Acuity Right Eye Distance:   Left Eye Distance:   Bilateral Distance:    Right Eye Near:   Left Eye Near:    Bilateral Near:     Physical Exam Vitals and nursing  note reviewed.  Constitutional:      General: He is not in acute distress.    Appearance: He is well-developed.  HENT:     Head: Normocephalic and atraumatic.  Eyes:     Conjunctiva/sclera: Conjunctivae normal.  Cardiovascular:     Rate and Rhythm: Normal rate and regular rhythm.     Heart sounds: No murmur heard. Pulmonary:     Effort: Pulmonary effort is normal. No respiratory distress.     Breath sounds: Normal breath sounds.  Abdominal:     Palpations: Abdomen is soft.     Tenderness: There is no abdominal tenderness.  Musculoskeletal:        General: No swelling.     Right elbow: No swelling, deformity or effusion. Normal range of motion. Tenderness present in lateral epicondyle.       Arms:     Cervical back: Neck supple.     Lumbar back: Spasms and tenderness present. Negative right straight leg raise test and negative left straight leg raise test.       Back:  Skin:    General: Skin is warm and dry.     Capillary Refill: Capillary refill takes less than 2 seconds.  Neurological:     Mental Status: He is alert.  Psychiatric:        Mood and Affect: Mood normal.      UC Treatments / Results  Labs (all labs ordered are listed, but only abnormal results are displayed) Labs Reviewed  POCT URINALYSIS DIP (MANUAL ENTRY)    EKG   Radiology No results found.  Procedures Procedures (including critical care time)  Medications Ordered in UC Medications - No data to display  Initial Impression / Assessment and Plan / UC Course  I have reviewed the triage vital signs and the nursing notes.  Pertinent labs & imaging results that were available during my care of the patient were reviewed by me and considered in my medical decision making (see chart for details).     Acute midline low back pain without sciatica  Lateral epicondylitis of right elbow   Mid back lower back pain. Urinalysis done today due to the dark urine does not show an infection. Right elbow  pain likely due to right lateral epicondyle tendinitis.  Will treat all with the following: Toradol injection given for pain Decadron  injection given for inflammation Prescription for Flexeril 5 mg 3 times daily as needed for muscle spasms.  Use caution as this can cause drowsiness. Return to urgent care or PCP if symptoms worsen or fail to resolve.    Final Clinical Impressions(s) / UC Diagnoses   Final diagnoses:  None   Discharge Instructions   None    ED Prescriptions   None    PDMP not reviewed this encounter.   Landis Martins, [place] 05/24/23 1334

## 2023-05-24 NOTE — ED Triage Notes (Addendum)
Interpreter: Bradly Bienenstock [id number]  Low back pain x 5 days. Sharp pain in the lower back with sexual activity. States urine is a "different" yellow as well.   Pain in the right elbow onset 3 weeks ago. No known falls or injuries. No history of elbow problems.

## 2023-08-24 ENCOUNTER — Other Ambulatory Visit: Payer: Self-pay

## 2023-08-24 ENCOUNTER — Encounter (HOSPITAL_COMMUNITY): Payer: Self-pay | Admitting: Emergency Medicine

## 2023-08-24 ENCOUNTER — Ambulatory Visit (HOSPITAL_COMMUNITY)
Admission: EM | Admit: 2023-08-24 | Discharge: 2023-08-24 | Disposition: A | Discharge status: Home / Self Care | Payer: BC Managed Care – PPO | Attending: Physician Assistant | Admitting: Physician Assistant

## 2023-08-24 DIAGNOSIS — J111 Influenza due to unidentified influenza virus with other respiratory manifestations: Secondary | ICD-10-CM | POA: Diagnosis not present

## 2023-08-24 LAB — POC COVID19/FLU A&B COMBO
Covid Antigen, POC: NEGATIVE
Influenza A Antigen, POC: NEGATIVE
Influenza B Antigen, POC: NEGATIVE

## 2023-08-24 LAB — POCT RAPID STREP A (OFFICE): Rapid Strep A Screen: NEGATIVE

## 2023-08-24 MED ORDER — IBUPROFEN 800 MG PO TABS
ORAL_TABLET | ORAL | Status: AC
  Filled 2023-08-24: qty 1

## 2023-08-24 MED ORDER — PREDNISONE 20 MG PO TABS: ORAL_TABLET | ORAL | 0 refills | Status: DC

## 2023-08-24 MED ORDER — AZITHROMYCIN 250 MG PO TABS: ORAL_TABLET | ORAL | 0 refills | Status: DC

## 2023-08-24 MED ORDER — IBUPROFEN 800 MG PO TABS
800.0000 mg | ORAL_TABLET | Freq: Once | ORAL | Status: AC
Start: 2023-08-24 — End: 2023-08-24
  Administered 2023-08-24: 800 mg via ORAL

## 2023-08-24 NOTE — Discharge Instructions (Addendum)
 At this time I suspect that you have a viral illness similar to a cough, cold, the flu.  It may be too early for your testing to show positive for the flu but your symptoms appear consistent with this.  Your testing was negative for flu and COVID.  Given your decreased breath sounds your high fever, increased pulse rate and overall body aches I would prefer to treat you with an antibiotic called azithromycin.  This is also known as a Z-Pak.  I am also sending in a steroid to help with your breathing and inflammation.  This will likely also help with some of your back pain.  I do recommend taking Tylenol and ibuprofen as needed for further pain. I have sent in a script for Prednisone taper to be taken in the morning with breakfast per the instructions on the container Remember that steroids can cause sleeplessness, irritability, increased hunger and elevated glucose levels so be mindful of these side effects. They should lessen as you progress to the lower doses of the taper. If at any point you start to develop fevers that are not responding to medications, difficulty breathing, passing out, coughing up blood or symptoms that are not resolving please follow-up here or go to the emergency room for further evaluation and management.

## 2023-08-24 NOTE — ED Provider Notes (Signed)
 [facility]    CSN: [id number] Arrival date & time: 08/24/23  1637      History   Chief Complaint Chief Complaint  Patient presents with   URI    HPI Louis Morse is a 53 y.o. male.   HPI  He is here with family who is assisting with translation  They state he has been having fever, body aches, back pain since last night  They report dry coughing and some chest pain with coughing  They deny recent sick contacts He has taken tylenol last night for symptoms    History reviewed. No pertinent past medical history.  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 No pertinent surgical history.     Home Medications    Prior to Admission medications   Medication Sig Start Date End Date Taking? Authorizing Provider  azithromycin (ZITHROMAX) 250 MG tablet Take 500mg  PO daily x1d and then 250mg  daily x4 days 08/24/23  Yes Raigan Baria E, PA-C  predniSONE (DELTASONE) 20 MG tablet Take 60mg  PO daily x 2 days, then40mg  PO daily x 2 days, then 20mg  PO daily x 3 days 08/24/23  Yes Christofer Shen E, PA-C  cyclobenzaprine (FLEXERIL) 5 MG tablet Take 1 tablet (5 mg total) by mouth 3 (three) times daily as needed for muscle spasms. May cause drowsiness 05/24/23   White, Elizabeth A, PA-C  meloxicam (MOBIC) 15 MG tablet Take 1 tablet (15 mg total) by mouth daily. 07/30/21   Madelyn Brunner, DO    Family History History reviewed. No pertinent family history.  Social History Social History   Tobacco Use   Smoking status: Never   Smokeless tobacco: Never  Vaping Use   Vaping status: Never Used  Substance Use Topics   Alcohol use: Yes   Drug use: Not Currently     Allergies   Patient has no known allergies.   Review of Systems Review of  Systems  Constitutional:  Positive for fever.  HENT:  Negative for congestion, ear pain, rhinorrhea and sore throat.   Respiratory:  Positive for cough. Negative for shortness of breath and wheezing.   Gastrointestinal:  Negative for diarrhea, nausea and vomiting.  Musculoskeletal:  Positive for myalgias.  Neurological:  Positive for dizziness. Negative for headaches.     Physical Exam Triage Vital Signs ED Triage Vitals  Encounter Vitals Group     BP 08/24/23 1829 103/66     Systolic BP Percentile --      Diastolic BP Percentile --      Pulse Rate 08/24/23 1829 (!) 102     Resp 08/24/23 1829 (!) 28     Temp 08/24/23 1829 (!) 103 F (39.4 C)     Temp Source 08/24/23 1829 Oral     SpO2 08/24/23 1829 94 %     Weight --      Height --      Head Circumference --      Peak Flow --      Pain Score 08/24/23 1826 8     Pain Loc --      Pain Education --      Exclude from Growth Chart --    No data found.  Updated Vital Signs BP 103/66 (BP Location: Right Arm)   Pulse Marland Kitchen)  102   Temp (!) 103 F (39.4 C) (Oral)   Resp (!) 28   SpO2 94%   Visual Acuity Right Eye Distance:   Left Eye Distance:   Bilateral Distance:    Right Eye Near:   Left Eye Near:    Bilateral Near:     Physical Exam Vitals reviewed.  Constitutional:      General: He is awake.     Appearance: He is well-developed and well-groomed. He is ill-appearing and diaphoretic.  HENT:     Head: Normocephalic and atraumatic.     Right Ear: Hearing, tympanic membrane and ear canal normal.     Left Ear: Hearing, tympanic membrane and ear canal normal.     Mouth/Throat:     Lips: Pink.     Mouth: Mucous membranes are moist.     Pharynx: Oropharynx is clear. Uvula midline. No pharyngeal swelling, oropharyngeal exudate, posterior oropharyngeal erythema or uvula swelling.  Cardiovascular:     Rate and Rhythm: Normal rate and regular rhythm.     Heart sounds: Normal heart sounds.  Pulmonary:     Effort:  Pulmonary effort is normal.     Breath sounds: Decreased air movement present. Examination of the right-lower field reveals decreased breath sounds. Examination of the left-lower field reveals decreased breath sounds. Decreased breath sounds present. No wheezing, rhonchi or rales.     Comments: Patient had to be coached several times to take deep breaths for auscultation.  Patient continued to take shallow breaths making auscultation slightly difficult.  No wheezes, rhonchi, Rales appreciated Musculoskeletal:     Cervical back: Normal range of motion and neck supple.  Lymphadenopathy:     Head:     Right side of head: No submental, submandibular or preauricular adenopathy.     Left side of head: No submental, submandibular or preauricular adenopathy.     Cervical:     Right cervical: No superficial cervical adenopathy.    Left cervical: No superficial cervical adenopathy.  Neurological:     Mental Status: He is alert.  Psychiatric:        Behavior: Behavior is cooperative.      UC Treatments / Results  Labs (all labs ordered are listed, but only abnormal results are displayed) Labs Reviewed  POC COVID19/FLU A&B COMBO  POCT RAPID STREP A (OFFICE)    EKG   Radiology No results found.  Procedures Procedures (including critical care time)  Medications Ordered in UC Medications  ibuprofen (ADVIL) tablet 800 mg (800 mg Oral Given 08/24/23 1839)    Initial Impression / Assessment and Plan / UC Course  I have reviewed the triage vital signs and the nursing notes.  Pertinent labs & imaging results that were available during my care of the patient were reviewed by me and considered in my medical decision making (see chart for details).      Final Clinical Impressions(s) / UC Diagnoses   Final diagnoses:  Influenza-like illness   Acute, new concern Patient presents with his family and they report that he has been having bodyaches, coughing, chest pain with coughing, fever  since yesterday.  He has taken Tylenol last night to assist with symptoms.  In clinic today patient is febrile and has elevated heart rate.  Oxygen saturation is 94%.  Physical exam is notable for diaphoresis, reduced breath sounds and reduced air movement.  Reviewed with patient and family that his rapid COVID and flu testing were negative but this may be too early for  positive result given his symptoms started last night.  At this time I do suspect likely influenza given his symptom constellation but with shallow breathing and decreased air movement I am concerned for potential bronchitis or even pneumonia.  Will send in a Z-Pak as well as prednisone taper to assist with this.  ED and return precautions were reviewed and provided in after visit summary.  Recommend close follow-up with PCP or return to urgent care if symptoms are persisting.    Discharge Instructions      At this time I suspect that you have a viral illness similar to a cough, cold, the flu.  It may be too early for your testing to show positive for the flu but your symptoms appear consistent with this.  Your testing was negative for flu and COVID.  Given your decreased breath sounds your high fever, increased pulse rate and overall body aches I would prefer to treat you with an antibiotic called azithromycin.  This is also known as a Z-Pak.  I am also sending in a steroid to help with your breathing and inflammation.  This will likely also help with some of your back pain.  I do recommend taking Tylenol and ibuprofen as needed for further pain. I have sent in a script for Prednisone taper to be taken in the morning with breakfast per the instructions on the container Remember that steroids can cause sleeplessness, irritability, increased hunger and elevated glucose levels so be mindful of these side effects. They should lessen as you progress to the lower doses of the taper. If at any point you start to develop fevers that are not  responding to medications, difficulty breathing, passing out, coughing up blood or symptoms that are not resolving please follow-up here or go to the emergency room for further evaluation and management.     ED Prescriptions     Medication Sig Dispense Auth. Provider   predniSONE (DELTASONE) 20 MG tablet Take 60mg  PO daily x 2 days, then40mg  PO daily x 2 days, then 20mg  PO daily x 3 days 13 tablet Kanija Remmel E, PA-C   azithromycin (ZITHROMAX) 250 MG tablet Take 500mg  PO daily x1d and then 250mg  daily x4 days 6 each Meghann Landing E, PA-C      PDMP not reviewed this encounter.   Roselind Messier 08/24/23 1928

## 2023-08-24 NOTE — ED Notes (Signed)
 Reviewed work note

## 2023-08-24 NOTE — ED Triage Notes (Signed)
 Symptoms started last night.  General aches and pain.  Patient has a cough and reports a fever, denies runny nose.    Has taken cyclobenzaprine, tylenol

## 2024-04-09 ENCOUNTER — Ambulatory Visit (HOSPITAL_COMMUNITY)
Admission: EM | Admit: 2024-04-09 | Discharge: 2024-04-09 | Disposition: A | Discharge status: Home / Self Care | Attending: Family Medicine | Admitting: Family Medicine

## 2024-04-09 ENCOUNTER — Encounter (HOSPITAL_COMMUNITY): Payer: Self-pay

## 2024-04-09 DIAGNOSIS — J0100 Acute maxillary sinusitis, unspecified: Secondary | ICD-10-CM

## 2024-04-09 MED ORDER — AMOXICILLIN-POT CLAVULANATE 875-125 MG PO TABS: 1.0000 | ORAL_TABLET | Freq: Two times a day (BID) | ORAL | 0 refills | Status: AC

## 2024-04-09 MED ORDER — BENZONATATE 200 MG PO CAPS: 200.0000 mg | ORAL_CAPSULE | Freq: Three times a day (TID) | ORAL | 0 refills | Status: AC | PRN

## 2024-04-09 NOTE — Discharge Instructions (Addendum)
 Uyu munsi wasuzumwe indwara ya sinus. Mboherereje antibiyotike yo mu kanwa n'imiti yo gukorora. Ndakugira inama yo gukoresha saline nasine spray kugirango ufashe namaraso yose. [place] hum[place] kandi wongere [place]. [place] gar[place] niba udatera imbere cyangwa ngo ube mubi.   You were diagnosed with a sinus infection today.  I have sent out an oral antibiotic and cough medication for you.  I recommend you use saline nasal spray as well to help with any nose bleeds.  Please get rest and increase fluids.  Please return if you are not improving or worsening.

## 2024-04-09 NOTE — ED Provider Notes (Signed)
 [facility]    CSN: [id number] Arrival date & time: 04/09/24  1034      History   Chief Complaint Chief Complaint  Patient presents with   Cough    HPI Louis Morse is a 53 y.o. male.    Cough Associated symptoms: fever and rhinorrhea    Patient's son is interpreting today.  Patient is here for URI symptoms x 2 weeks.  Having headaches, cough, sinus and chest congestion.   He had a nose bleed today.  Some sob at times, but having sinus pain and pressure which he contributes to this.  He may have had a fever, but not clear.  No otc medications taken.        History reviewed. No pertinent past medical history.  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 No pertinent surgical history.     Home Medications    Prior to Admission medications   Medication Sig Start Date End Date Taking? Authorizing Provider  azithromycin  (ZITHROMAX ) 250 MG tablet Take 500mg  PO daily x1d and then 250mg  daily x4 days 08/24/23   Mecum, Lotus Santillo E, PA-C  cyclobenzaprine  (FLEXERIL ) 5 MG tablet Take 1 tablet (5 mg total) by mouth 3 (three) times daily as needed for muscle spasms. May cause drowsiness 05/24/23   Teresa Norris A, PA-C  meloxicam  (MOBIC ) 15 MG tablet Take 1 tablet (15 mg total) by mouth daily. 07/30/21   Brooks, Dana, DO  predniSONE  (DELTASONE ) 20 MG tablet Take 60mg  PO daily x 2 days, then40mg  PO daily x 2 days, then 20mg  PO daily x 3 days 08/24/23   Mecum, Deem Marmol E, PA-C    Family History History reviewed. No pertinent family history.  Social History Social History   Tobacco Use   Smoking status: Never   Smokeless tobacco: Never  Vaping Use   Vaping status: Never Used  Substance Use Topics   Alcohol use: Yes   Drug use:  Not Currently     Allergies   Patient has no known allergies.   Review of Systems Review of Systems  Constitutional:  Positive for fever.  HENT:  Positive for congestion, nosebleeds and rhinorrhea.   Respiratory:  Positive for cough.   Cardiovascular: Negative.   Gastrointestinal: Negative.   Musculoskeletal: Negative.   Psychiatric/Behavioral: Negative.       Physical Exam Triage Vital Signs ED Triage Vitals  Encounter Vitals Group     BP 04/09/24 1056 128/86     Girls Systolic BP Percentile --      Girls Diastolic BP Percentile --      Boys Systolic BP Percentile --      Boys Diastolic BP Percentile --      Pulse Rate 04/09/24 1056 75     Resp 04/09/24 1056 18     Temp 04/09/24 1056 98 F (36.7 C)     Temp Source 04/09/24 1056 Oral     SpO2 04/09/24 1056 97 %     Weight --      Height --      Head Circumference --      Peak Flow --      Pain Score 04/09/24 1054 3     Pain Loc --      Pain Education --  Exclude from Growth Chart --    No data found.  Updated Vital Signs BP 128/86 (BP Location: Right Arm)   Pulse 75   Temp 98 F (36.7 C) (Oral)   Resp 18   SpO2 97%   Visual Acuity Right Eye Distance:   Left Eye Distance:   Bilateral Distance:    Right Eye Near:   Left Eye Near:    Bilateral Near:     Physical Exam Constitutional:      General: He is not in acute distress.    Appearance: Normal appearance. He is normal weight. He is ill-appearing. He is not toxic-appearing.  HENT:     Nose: Congestion and rhinorrhea present.     Right Sinus: Maxillary sinus tenderness present.     Left Sinus: Maxillary sinus tenderness present.  Cardiovascular:     Rate and Rhythm: Normal rate and regular rhythm.  Pulmonary:     Effort: Pulmonary effort is normal.     Breath sounds: Normal breath sounds. No wheezing, rhonchi or rales.  Musculoskeletal:     Cervical back: Normal range of motion and neck supple. No tenderness.  Lymphadenopathy:      Cervical: No cervical adenopathy.  Neurological:     Mental Status: He is alert.      UC Treatments / Results  Labs (all labs ordered are listed, but only abnormal results are displayed) Labs Reviewed - No data to display  EKG   Radiology No results found.  Procedures Procedures (including critical care time)  Medications Ordered in UC Medications - No data to display  Initial Impression / Assessment and Plan / UC Course  I have reviewed the triage vital signs and the nursing notes.  Pertinent labs & imaging results that were available during my care of the patient were reviewed by me and considered in my medical decision making (see chart for details).   Final Clinical Impressions(s) / UC Diagnoses   Final diagnoses:  Acute non-recurrent maxillary sinusitis     Discharge Instructions      Uyu munsi wasuzumwe indwara ya sinus. Mboherereje antibiyotike yo mu kanwa n'imiti yo gukorora. Ndakugira inama yo gukoresha saline nasine spray kugirango ufashe namaraso yose. [place] hum[place] kandi wongere [place]. [place] niba udatera imbere cyangwa ngo ube mubi.   You were diagnosed with a sinus infection today.  I have sent out an oral antibiotic and cough medication for you.  I recommend you use saline nasal spray as well to help with any nose bleeds.  Please get rest and increase fluids.  Please return if you are not improving or worsening.     ED Prescriptions     Medication Sig Dispense Auth. Provider   amoxicillin-clavulanate (AUGMENTIN) 875-125 MG tablet Take 1 tablet by mouth every 12 (twelve) hours. 14 tablet Hilliary Jock, MD   benzonatate  (TESSALON ) 200 MG capsule Take 1 capsule (200 mg total) by mouth 3 (three) times daily as needed for cough. 21 capsule Darral Longs, MD      PDMP not reviewed this encounter.   Darral Longs, MD 04/09/24 1116

## 2024-04-09 NOTE — ED Triage Notes (Signed)
 Headaches, cough, fever, and congestion for 2 weeks. Patient also had a nose bleed today. No known sick exposure.   Has not taken any meds for current symptoms.
